# Patient Record
Sex: Male | Born: 1940 | Race: White | Hispanic: No | Marital: Married | Smoking: Never smoker
Health system: Southern US, Community
[De-identification: ages and names within clinical notes are randomized; demographics above are authoritative.]

## PROBLEM LIST (undated history)

## (undated) DIAGNOSIS — G20 Parkinson's disease: Secondary | ICD-10-CM

## (undated) DIAGNOSIS — K219 Gastro-esophageal reflux disease without esophagitis: Secondary | ICD-10-CM

## (undated) DIAGNOSIS — D869 Sarcoidosis, unspecified: Secondary | ICD-10-CM

## (undated) DIAGNOSIS — G20A1 Parkinson's disease without dyskinesia, without mention of fluctuations: Secondary | ICD-10-CM

## (undated) HISTORY — DX: Gastro-esophageal reflux disease without esophagitis: K21.9

## (undated) HISTORY — DX: Parkinson's disease without dyskinesia, without mention of fluctuations: G20.A1

## (undated) HISTORY — DX: Sarcoidosis, unspecified: D86.9

## (undated) HISTORY — DX: Parkinson's disease: G20

---

## 2012-08-07 ENCOUNTER — Ambulatory Visit: Payer: Self-pay | Admitting: Internal Medicine

## 2012-09-10 ENCOUNTER — Ambulatory Visit: Payer: Self-pay | Admitting: Gastroenterology

## 2014-08-31 DIAGNOSIS — R0609 Other forms of dyspnea: Secondary | ICD-10-CM | POA: Diagnosis not present

## 2014-08-31 DIAGNOSIS — Z79899 Other long term (current) drug therapy: Secondary | ICD-10-CM | POA: Diagnosis not present

## 2014-08-31 DIAGNOSIS — N400 Enlarged prostate without lower urinary tract symptoms: Secondary | ICD-10-CM | POA: Diagnosis not present

## 2014-08-31 DIAGNOSIS — E780 Pure hypercholesterolemia: Secondary | ICD-10-CM | POA: Diagnosis not present

## 2014-09-07 DIAGNOSIS — R0609 Other forms of dyspnea: Secondary | ICD-10-CM | POA: Diagnosis not present

## 2014-09-14 DIAGNOSIS — H35372 Puckering of macula, left eye: Secondary | ICD-10-CM | POA: Diagnosis not present

## 2015-03-01 DIAGNOSIS — R634 Abnormal weight loss: Secondary | ICD-10-CM | POA: Diagnosis not present

## 2015-03-01 DIAGNOSIS — E559 Vitamin D deficiency, unspecified: Secondary | ICD-10-CM | POA: Diagnosis not present

## 2015-03-01 DIAGNOSIS — Z125 Encounter for screening for malignant neoplasm of prostate: Secondary | ICD-10-CM | POA: Diagnosis not present

## 2015-03-01 DIAGNOSIS — Z0000 Encounter for general adult medical examination without abnormal findings: Secondary | ICD-10-CM | POA: Diagnosis not present

## 2015-03-01 DIAGNOSIS — N400 Enlarged prostate without lower urinary tract symptoms: Secondary | ICD-10-CM | POA: Diagnosis not present

## 2015-03-01 DIAGNOSIS — E780 Pure hypercholesterolemia: Secondary | ICD-10-CM | POA: Diagnosis not present

## 2015-03-01 DIAGNOSIS — Z79899 Other long term (current) drug therapy: Secondary | ICD-10-CM | POA: Diagnosis not present

## 2015-03-09 DIAGNOSIS — Z1211 Encounter for screening for malignant neoplasm of colon: Secondary | ICD-10-CM | POA: Diagnosis not present

## 2015-03-18 DIAGNOSIS — H35373 Puckering of macula, bilateral: Secondary | ICD-10-CM | POA: Diagnosis not present

## 2015-04-13 DIAGNOSIS — D485 Neoplasm of uncertain behavior of skin: Secondary | ICD-10-CM | POA: Diagnosis not present

## 2015-04-13 DIAGNOSIS — L821 Other seborrheic keratosis: Secondary | ICD-10-CM | POA: Diagnosis not present

## 2015-04-13 DIAGNOSIS — C44329 Squamous cell carcinoma of skin of other parts of face: Secondary | ICD-10-CM | POA: Diagnosis not present

## 2015-04-22 DIAGNOSIS — C44329 Squamous cell carcinoma of skin of other parts of face: Secondary | ICD-10-CM | POA: Diagnosis not present

## 2015-08-24 DIAGNOSIS — Z872 Personal history of diseases of the skin and subcutaneous tissue: Secondary | ICD-10-CM | POA: Diagnosis not present

## 2015-08-24 DIAGNOSIS — L821 Other seborrheic keratosis: Secondary | ICD-10-CM | POA: Diagnosis not present

## 2015-08-24 DIAGNOSIS — L570 Actinic keratosis: Secondary | ICD-10-CM | POA: Diagnosis not present

## 2015-08-24 DIAGNOSIS — Z85828 Personal history of other malignant neoplasm of skin: Secondary | ICD-10-CM | POA: Diagnosis not present

## 2015-08-24 DIAGNOSIS — X32XXXA Exposure to sunlight, initial encounter: Secondary | ICD-10-CM | POA: Diagnosis not present

## 2015-08-24 DIAGNOSIS — Z08 Encounter for follow-up examination after completed treatment for malignant neoplasm: Secondary | ICD-10-CM | POA: Diagnosis not present

## 2015-08-30 DIAGNOSIS — N400 Enlarged prostate without lower urinary tract symptoms: Secondary | ICD-10-CM | POA: Diagnosis not present

## 2015-08-30 DIAGNOSIS — E7800 Pure hypercholesterolemia, unspecified: Secondary | ICD-10-CM | POA: Diagnosis not present

## 2015-08-30 DIAGNOSIS — E559 Vitamin D deficiency, unspecified: Secondary | ICD-10-CM | POA: Diagnosis not present

## 2015-08-30 DIAGNOSIS — J069 Acute upper respiratory infection, unspecified: Secondary | ICD-10-CM | POA: Diagnosis not present

## 2015-08-30 DIAGNOSIS — Z79899 Other long term (current) drug therapy: Secondary | ICD-10-CM | POA: Diagnosis not present

## 2015-09-07 DIAGNOSIS — R4189 Other symptoms and signs involving cognitive functions and awareness: Secondary | ICD-10-CM | POA: Diagnosis not present

## 2015-09-07 DIAGNOSIS — R490 Dysphonia: Secondary | ICD-10-CM | POA: Diagnosis not present

## 2015-09-07 DIAGNOSIS — E538 Deficiency of other specified B group vitamins: Secondary | ICD-10-CM | POA: Diagnosis not present

## 2015-09-07 DIAGNOSIS — G20 Parkinson's disease: Secondary | ICD-10-CM | POA: Diagnosis not present

## 2015-09-08 DIAGNOSIS — G20 Parkinson's disease: Secondary | ICD-10-CM | POA: Insufficient documentation

## 2015-09-08 DIAGNOSIS — G20A1 Parkinson's disease without dyskinesia, without mention of fluctuations: Secondary | ICD-10-CM | POA: Insufficient documentation

## 2015-09-10 DIAGNOSIS — H35373 Puckering of macula, bilateral: Secondary | ICD-10-CM | POA: Diagnosis not present

## 2015-09-22 ENCOUNTER — Other Ambulatory Visit: Payer: Self-pay | Admitting: Neurology

## 2015-09-22 DIAGNOSIS — R2689 Other abnormalities of gait and mobility: Secondary | ICD-10-CM

## 2015-10-11 ENCOUNTER — Ambulatory Visit
Admission: RE | Admit: 2015-10-11 | Discharge: 2015-10-11 | Disposition: A | Discharge status: Home / Self Care | Payer: Commercial Managed Care - HMO | Source: Ambulatory Visit | Attending: Neurology | Admitting: Neurology

## 2015-10-11 DIAGNOSIS — R4189 Other symptoms and signs involving cognitive functions and awareness: Secondary | ICD-10-CM | POA: Insufficient documentation

## 2015-10-11 DIAGNOSIS — G319 Degenerative disease of nervous system, unspecified: Secondary | ICD-10-CM | POA: Insufficient documentation

## 2015-10-11 DIAGNOSIS — I739 Peripheral vascular disease, unspecified: Secondary | ICD-10-CM | POA: Diagnosis not present

## 2015-10-11 DIAGNOSIS — G20 Parkinson's disease: Secondary | ICD-10-CM | POA: Insufficient documentation

## 2015-10-11 DIAGNOSIS — R2689 Other abnormalities of gait and mobility: Secondary | ICD-10-CM

## 2015-10-11 DIAGNOSIS — R930 Abnormal findings on diagnostic imaging of skull and head, not elsewhere classified: Secondary | ICD-10-CM | POA: Diagnosis not present

## 2015-10-27 ENCOUNTER — Ambulatory Visit: Payer: Commercial Managed Care - HMO | Admitting: Physical Therapy

## 2015-10-27 ENCOUNTER — Encounter: Payer: Self-pay | Admitting: Speech Pathology

## 2015-10-27 ENCOUNTER — Ambulatory Visit: Payer: Commercial Managed Care - HMO | Attending: Neurology | Admitting: Speech Pathology

## 2015-10-27 ENCOUNTER — Encounter: Payer: Self-pay | Admitting: Physical Therapy

## 2015-10-27 DIAGNOSIS — R490 Dysphonia: Secondary | ICD-10-CM | POA: Insufficient documentation

## 2015-10-27 DIAGNOSIS — R2681 Unsteadiness on feet: Secondary | ICD-10-CM | POA: Insufficient documentation

## 2015-10-27 DIAGNOSIS — R262 Difficulty in walking, not elsewhere classified: Secondary | ICD-10-CM | POA: Insufficient documentation

## 2015-10-27 NOTE — Therapy (Signed)
[place] MAIN [facility] SERVICES [address] [place], [place], [address] Phone: [phone]   Fax:  [phone]  Speech Language Pathology Evaluation  Patient Details  Name: Barry Joyce MRN: [id number] Date of Birth: 08-25-1940 Referring Provider: Dr.Shah  Encounter Date: 10/27/2015      End of Session - 10/27/15 1021    Visit Number 1   Number of Visits 17   Date for SLP Re-Evaluation 12/01/15   SLP Start Time 1000   SLP Stop Time  [id number]   SLP Time Calculation (min) 51 min   Activity Tolerance Patient tolerated treatment well      Past Medical History  Diagnosis Date   GERD (gastroesophageal reflux disease)    Parkinson disease ([place])    Sarcoidosis ([place])     History reviewed. No pertinent past surgical history.  There were no vitals filed for this visit.      Subjective Assessment - 10/27/15 1020    Subjective The patient reports that speech/voice changes due to Parkinson's; changes include hypophonia, hoarse vocal quality, monotone speech.   Currently in Pain? No/denies            SLP Evaluation [facility] - 10/27/15 0001    SLP Visit Information   SLP Received On 10/27/15   Referring Provider Dr.Shah   Onset Date 09/07/2015   Medical Diagnosis Parkinson's disease   Subjective   Patient/Family Stated Goal Maximize communication   Prior Functional Status   Cognitive/Linguistic Baseline Within functional limits   Oral Motor/Sensory Function   Overall Oral Motor/Sensory Function Appears within functional limits for tasks assessed   Motor Speech   Overall Motor Speech Impaired   Respiration Impaired   Level of Impairment Conversation   Phonation Low vocal intensity   Resonance Within functional limits   Articulation Within functional limitis   Intelligibility Intelligible   Phonation Impaired   Vocal Abuses Habitual Hyperphonia  Hoarse   Standardized Assessments   Standardized Assessments  Other Assessment   LSVT Perceptual Voice Evaluation        LSVT-LOUD Voice Evaluation  Voice history: The patient reports that speech/voice changes due to Parkinsons; changes include hypophonia, hoarse vocal quality, monotone speech.  Maximum phonation time for sustained ah: 25 seconds  Mean intensity during sustained ah: 68 dB   Mean intensity sustained during conversational speech: 63 dB  Average fundamental frequency during sustained ah: 179 Hz (1.5 STD above mean for age and gender)  Visi-Pitch: Museum/gallery exhibitions officer (MDVP)  MDVP extracts objective quantitative values (Relative Average Perturbation, Shimmer, Voice Turbulence Index, and Noise to Harmonic Ratio) on sustained phonation, which are displayed graphically and numerically in comparison to a built-in normative database.  The patient exhibited values outside the norm for Relative Average Perturbation, Shimmer, Voice Turbulence Index, and Noise to Harmonic Ratio.  Average fundamental frequency was 3 STD below the average for age and gender. The patient improved all parameters when cued to alter voicing (loud like me).   Simulatability: Improved vocal quality with loud voice.         SLP Education - 10/27/15 1020    Education provided Yes   Education Details LSVT-LOUD protocol   Person(s) Educated Patient   Methods Explanation   Comprehension Verbalized understanding            SLP Long Term Goals - 10/27/15 1022    SLP LONG TERM GOAL #1   Title The patient will complete Daily Tasks (Maximum duration "ah", High/Lows, and Functional  Phrases) at average loudness of 80 dB and with loud, good quality voice.    Time 4   Period Weeks   Status New   SLP LONG TERM GOAL #2   Title The patient will complete Hierarchal Speech Loudness reading drills (words/phrases, sentences, and paragraph) at average 75 dB and with loud, good quality voice.     Time 4   Period Weeks   Status New   SLP LONG TERM GOAL #3   Title The  patient will complete homework daily.   Time 4   Period Weeks   Status New   SLP LONG TERM GOAL #4   Title The patient will participate in conversation, maintaining average loudness of 75 dB and loud, good quality voice.   Time 4   Period Weeks   Status New          Plan - 10/27/15 1025    Clinical Impression Statement This 75 year old man with diagnosed Parkinson's disease is presenting with moderate voice disorder characterized by hoarse vocal quality, hypophonia, and monotone voice.  Based on stimulability testing, the patient is judged to be a good candidate for the LSVT LOUD program.  It is recommended that the patient receive the LSVT LOUD program which is comprised of 16 intensive sessions (4 times per week for 4 weeks, one hour sessions).  Prognosis for improvement is good based on motivation, stimulability, and strong family support.  LSVT LOUD has been documented in the literature as efficacious for individuals with Parkinson's disease.     Speech Therapy Frequency 4x / week   Duration 4 weeks   Treatment/Interventions Other (comment)  LSVT-LOUD   Potential to Achieve Goals Good   Potential Considerations Ability to learn/carryover information;Co-morbidities;Cooperation/participation level;Medical prognosis;Previous level of function;Severity of impairments;Family/community support   SLP Home Exercise Plan LSVT-LOUD daily homework   Consulted and Agree with Plan of Care Patient      Patient will benefit from skilled therapeutic intervention in order to improve the following deficits and impairments:   Dysphonia - Plan: SLP plan of care cert/re-cert      G-Codes - [id number]    Functional Assessment Tool Used LSVT-LOUD evaluation protocol, clinical jegment   Functional Limitations Voice   Voice Current Status (G9171) At least 40 percent but less than 60 percent impaired, limited or restricted   Voice Goal Status (G9172) At least 1 percent but less than 20 percent  impaired, limited or restricted      Problem List There are no active problems to display for this patient.  Leroy Sea, MS/CCC- SLP  Lou Miner 10/27/2015, 11:11 AM  [place] MAIN [facility] SERVICES [address] [place], [place], [address] Phone: [phone]   Fax:  [phone]  Name: Barry Joyce MRN: [id number] Date of Birth: 20-Mar-1941

## 2015-10-27 NOTE — Therapy (Addendum)
[place] MAIN [facility] SERVICES [address] [place], [place], [address] Phone: [phone]   Fax:  [phone]  Physical Therapy Evaluation  Patient Details  Name: Barry Joyce MRN: [id number] Date of Birth: [birth year] Referring Provider: Manuella Ghazi  Encounter Date: 10/27/2015      PT End of Session - 10/27/15 1111    Visit Number 1   Number of Visits 17   Date for PT Re-Evaluation 11/26/15   PT Start Time 1100   PT Stop Time 1200   PT Time Calculation (min) 60 min      Past Medical History  Diagnosis Date  . GERD (gastroesophageal reflux disease)   . Parkinson disease ([place])   . Sarcoidosis ([place])     History reviewed. No pertinent past surgical history.  There were no vitals filed for this visit.       Subjective Assessment - 10/27/15 1100    Subjective Patient has some minor changes in his stepping during walking.    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 No/denies            [facility] PT Assessment - 10/27/15 0001    Assessment   Medical Diagnosis Parkinsons disease   Referring Provider Manuella Ghazi   Onset Date/Surgical Date 09/07/15   Hand Dominance Right   Prior Therapy none   Precautions   Precautions Fall   Restrictions   Weight Bearing Restrictions No   Balance Screen   Has the patient fallen in the past 6 months Yes   How many times? 1   Has the patient had a decrease in activity level because of a fear of falling?  Yes   Is the patient reluctant to leave their home because of a fear of falling?  No   Home Ecologist residence   Living Arrangements Spouse/significant other   Available Help at Discharge Family   Type of [place] to enter   Entrance Stairs-Number of Steps 3   Entrance Stairs-Rails None   Home Layout Two level   Alternate Level Stairs-Number of Steps 12   Alternate Level  Stairs-Rails Right   Home Equipment None   Prior Function   Level of Independence Independent   Vocation Retired   Leisure grandchildren, Insurance risk surveyor   Cognition   Overall Cognitive Status Within Functional Limits for tasks assessed      PAIN:  POSTURE: WFL  PROM/AROM: WFL  STRENGTH:  Graded on a 0-5 scale Muscle Group Left Right  Shoulder flex    Shoulder Abd    Shoulder Ext    Shoulder IR/ER    Elbow    Wrist/hand    Hip Flex 5 5  Hip Abd 5 5  Hip Add 5 5  Hip Ext 5 5  Hip IR/ER 5 5  Knee Flex 5 5  Knee Ext 5 5  Ankle DF 5 5  Ankle PF 5 5   SENSATION: WFL    FUNCTIONAL MOBILITY: WFL   BALANCE: RLE single leg is 4 seconds   GAIT: Decreased arm swing on left side, no AD, needs railing for steps OUTCOME MEASURES: TEST Outcome Interpretation  5 times sit<>stand 9.54sec >60 yo, >15 sec indicates increased risk for falls  10 meter walk test     1. 96            m/s <1.0 m/s indicates increased  risk for falls; limited community ambulator  Timed up and Go   4.18             sec <14 sec indicates increased risk for falls  6 minute walk test   1420             Feet 1000 feet is community Water quality scientist 56/56 <36/56 (100% risk for falls), 37-45 (80% risk for falls); 46-51 (>50% risk for falls); 52-55 (lower risk <25% of falls)  9 Hole Peg Test L:  26.67              R: 25.75         Plan - 11/01/15 1250    Clinical Impression Statement Patient has Dx of parkinsons and has unsteady gait and uncoordinated motor control with stepping patterns. He has decreased dynamic standing balance and decreasd fine motor coordination.    Rehab Potential Good   PT Frequency 4x / week   PT Duration 4 weeks   PT Treatment/Interventions Balance training;Therapeutic exercise;Therapeutic activities   PT Next Visit Plan LSVT BIG   Consulted and Agree with Plan of Care Patient                          PT Education - 05-Nov-2015 1111    Education  provided Yes   Education Details LSVT BIG   Person(s) Educated Patient   Methods Explanation   Comprehension Verbalized understanding             PT Long Term Goals - 2015/11/05 1152    PT LONG TERM GOAL #2   Title Patient will tolerate 5 seconds of single leg stance without loss of balance to improve ability to get in and out of shower safely   Time 4   Period Weeks   Status New   PT LONG TERM GOAL #3   Title Patient will ascend/descend 4 stairs without rail assist independently without loss of balance to improve ability to get in/out of home   Time 4   Period Weeks   Status New             Patient will benefit from skilled therapeutic intervention in order to improve the following deficits and impairments:     Visit Diagnosis: Difficulty in walking, not elsewhere classified  Unsteadiness on feet      G-Codes - 05-Nov-2015 1125    Functional Assessment Tool Used 6 mw, berg balance   Functional Limitation Mobility: Walking and moving around   Mobility: Walking and Moving Around Current Status [id number]) At least 1 percent but less than 20 percent impaired, limited or restricted   Mobility: Walking and Moving Around Goal Status [id number]) 0 percent impaired, limited or restricted       Problem List There are no active problems to display for this patient.  Alanson Puls, PT, DPT [place], Minette Headland S 11/05/15, 11:55 AM  [place] MAIN [facility] SERVICES [address] [place], [place], [address] Phone: [phone]   Fax:  [phone]  Name: Barry Joyce MRN: [id number] Date of Birth: 1940-12-21

## 2015-11-01 ENCOUNTER — Ambulatory Visit: Payer: Commercial Managed Care - HMO | Admitting: Physical Therapy

## 2015-11-01 ENCOUNTER — Ambulatory Visit: Payer: Commercial Managed Care - HMO | Attending: Neurology | Admitting: Speech Pathology

## 2015-11-01 ENCOUNTER — Encounter: Payer: Self-pay | Admitting: Speech Pathology

## 2015-11-01 ENCOUNTER — Encounter: Payer: Self-pay | Admitting: Physical Therapy

## 2015-11-01 DIAGNOSIS — R262 Difficulty in walking, not elsewhere classified: Secondary | ICD-10-CM | POA: Insufficient documentation

## 2015-11-01 DIAGNOSIS — R490 Dysphonia: Secondary | ICD-10-CM | POA: Insufficient documentation

## 2015-11-01 DIAGNOSIS — R2681 Unsteadiness on feet: Secondary | ICD-10-CM

## 2015-11-01 NOTE — Therapy (Signed)
[place] MAIN [facility] SERVICES [address] [place], [place], [address] Phone: [phone]   Fax:  [phone]  Speech Language Pathology Treatment  Patient Details  Name: Barry Joyce MRN: [id number] Date of Birth: 1941/02/24 Referring Provider: Dr.Shah  Encounter Date: 11/01/2015      End of Session - 11/01/15 1521    Visit Number 2   Number of Visits 17   Date for SLP Re-Evaluation 12/01/15   SLP Start Time 1000   SLP Stop Time  1100   SLP Time Calculation (min) 60 min   Activity Tolerance Patient tolerated treatment well      Past Medical History  Diagnosis Date  . GERD (gastroesophageal reflux disease)   . Parkinson disease ([place])   . Sarcoidosis ([place])     History reviewed. No pertinent past surgical history.  There were no vitals filed for this visit.      Subjective Assessment - 11/01/15 1520    Subjective The patient reports that speech/voice changes due to Parkinson's; changes include hypophonia, hoarse vocal quality, monotone speech.   Currently in Pain? No/denies               ADULT SLP TREATMENT - 11/01/15 0001    General Information   Behavior/Cognition Alert;Cooperative;Pleasant mood   Treatment Provided   Treatment provided Cognitive-Linquistic   Pain Assessment   Pain Assessment No/denies pain   Cognitive-Linquistic Treatment   Treatment focused on Voice   Skilled Treatment Daily Task #1 (Maximum sustained "ah"): Average 12 seconds, 80 dB. Daily Task 2 (Maximum fundamental frequency range): Highs: 15 high pitched "ah" given max cues. Lows: 15 low pitched "ah" given max cues. Daily task #3 (Maximum speech loudness drill of functional phrases): Average 70 dB.  Hierarchal speech loudness drill: Read phrases/sentences, 72 dB. Generate short responses, 71 dB.  Homework: assignments given.  Off the cuff remarks: average 69 dB.      Assessment / Recommendations / Plan   Plan Continue with current plan of  care   Progression Toward Goals   Progression toward goals Progressing toward goals          SLP Education - 11/01/15 1520    Education provided Yes   Education Details LSVT-LOUD   Person(s) Educated Patient   Methods Explanation;Demonstration;Verbal cues;Handout   Comprehension Verbalized understanding;Returned demonstration;Verbal cues required;Need further instruction            SLP Long Term Goals - 10/27/15 1022    SLP LONG TERM GOAL #1   Title The patient will complete Daily Tasks (Maximum duration "ah", High/Lows, and Functional Phrases) at average loudness of 80 dB and with loud, good quality voice.    Time 4   Period Weeks   Status New   SLP LONG TERM GOAL #2   Title The patient will complete Hierarchal Speech Loudness reading drills (words/phrases, sentences, and paragraph) at average 75 dB and with loud, good quality voice.     Time 4   Period Weeks   Status New   SLP LONG TERM GOAL #3   Title The patient will complete homework daily.   Time 4   Period Weeks   Status New   SLP LONG TERM GOAL #4   Title The patient will participate in conversation, maintaining average loudness of 75 dB and loud, good quality voice.   Time 4   Period Weeks   Status New          Plan - 11/01/15  [place] The patient is completing daily tasks and hierarchal speech drill tasks with loud, good quality voice given mod-max SLP cues for vocal quality and loudness.     Speech Therapy Frequency 4x / week   Duration 4 weeks   Treatment/Interventions --  LSVT-LOUD   Potential to Achieve Goals Good   Potential Considerations Ability to learn/carryover information;Co-morbidities;Cooperation/participation level;Medical prognosis;Previous level of function;Severity of impairments;Family/community support   SLP Home Exercise Plan LSVT-LOUD daily homework   Consulted and Agree with Plan of Care Patient      Patient will benefit from skilled therapeutic  intervention in order to improve the following deficits and impairments:   Dysphonia    Problem List There are no active problems to display for this patient.  Barry Joyce, [place], [name] 11/01/2015, 3:22 PM  [place] MAIN [facility] SERVICES [address] [place], [place], [address] Phone: [phone]   Fax:  [phone]   Name: Barry Joyce MRN: [id number] Date of Birth: [birth year]

## 2015-11-01 NOTE — Addendum Note (Signed)
Addended by: Alanson Puls on: 11/01/2015 01:03 PM   Modules accepted: Orders

## 2015-11-01 NOTE — Therapy (Signed)
[place] MAIN [facility] SERVICES [address] [place], [place], [address] Phone: [phone]   Fax:  [phone]  Physical Therapy Treatment  Patient Details  Name: Barry Joyce MRN: [id number] Date of Birth: Feb 07, 1941 Referring Provider: Manuella Ghazi  Encounter Date: 11/01/2015      PT End of Session - 11/01/15 1240    Visit Number 2   Number of Visits 17   Date for PT Re-Evaluation 11/26/15   PT Start Time 1100   PT Stop Time 1200   PT Time Calculation (min) 60 min      Past Medical History  Diagnosis Date  . GERD (gastroesophageal reflux disease)   . Parkinson disease ([place])   . Sarcoidosis ([place])     History reviewed. No pertinent past surgical history.  There were no vitals filed for this visit.      Subjective Assessment - 11/01/15 1239    Subjective Patient is ready to begin LSVT BIG.   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 No/denies        Floor to ceiling x 10 reps, side to side 10 reps, step and reach forward x 10 reps, step and reach backwards x 10, step and reach sideways x 10 Rock and reach forward/backward x 10 , Rock and reach sideways x 10, functional tasks; 1 sit to stand X 10. 2. High level balance activities with foam tapping, foam stepping, 1/2 foam roller with cone stacking low and fwd. Gait training with big swing reciprocally 1000 feet x 5. Patient has unsteady stepping with exercises and his motor control is slow. Patient needs occasional verbal cueing to improve posture and cueing to correctly perform exercises slowly, holding at end of range to increase motor firing of desired muscle to encourage fatigue                           PT Education - 11/01/15 1239    Education provided Yes   Education Details LSVT BIG   Person(s) Educated Patient   Methods Explanation   Comprehension Verbalized understanding              PT Long Term Goals - 11/01/15 1247    PT LONG TERM GOAL #2   Title Patient will tolerate 5 seconds of single leg stance without loss of balance to improve ability to get in and out of shower safely   Time 4   Period Weeks   Status New   PT LONG TERM GOAL #3   Title Patient will ascend/descend 4 stairs without rail assist independently without loss of balance to improve ability to get in/out of home   Time 4   Period Weeks   Status New               Plan - 11/01/15 1319    Clinical Impression Statement Patient has unsteady gait and uncoordinated motor control with stepping patterns.    Rehab Potential Good   PT Frequency 4x / week   PT Duration 4 weeks   PT Treatment/Interventions Balance training;Therapeutic exercise;Therapeutic activities   PT Next Visit Plan LSVT BIG   Consulted and Agree with Plan of Care Patient      Patient will benefit from skilled therapeutic intervention in order to improve the following deficits and impairments:  Decreased balance, Difficulty walking, Decreased coordination  Visit Diagnosis: Difficulty in walking, not elsewhere  classified  Unsteadiness on feet     Problem List There are no active problems to display for this patient. Alanson Puls, PT, DPT  [place], [place] S 11/01/2015, 1:21 PM  [place] MAIN [facility] SERVICES [address] [place], [place], [address] Phone: [phone]   Fax:  [phone]  Name: Barry Joyce MRN: [id number] Date of Birth: [birth year]

## 2015-11-02 ENCOUNTER — Encounter: Payer: Self-pay | Admitting: Physical Therapy

## 2015-11-02 ENCOUNTER — Ambulatory Visit: Payer: Commercial Managed Care - HMO | Admitting: Speech Pathology

## 2015-11-02 ENCOUNTER — Ambulatory Visit: Payer: Commercial Managed Care - HMO | Admitting: Physical Therapy

## 2015-11-02 ENCOUNTER — Encounter: Payer: Self-pay | Admitting: Speech Pathology

## 2015-11-02 DIAGNOSIS — R262 Difficulty in walking, not elsewhere classified: Secondary | ICD-10-CM

## 2015-11-02 DIAGNOSIS — R490 Dysphonia: Secondary | ICD-10-CM | POA: Diagnosis not present

## 2015-11-02 DIAGNOSIS — R2681 Unsteadiness on feet: Secondary | ICD-10-CM

## 2015-11-02 NOTE — Therapy (Signed)
[place] MAIN [facility] SERVICES [address] [place], [place], [address] Phone: [phone]   Fax:  [phone]  Physical Therapy Treatment  Patient Details  Name: Barry Joyce MRN: [id number] Date of Birth: 1941/02/26 Referring Provider: Manuella Ghazi  Encounter Date: 11/02/2015      PT End of Session - 11/02/15 1103    Visit Number 3   Number of Visits 17   Date for PT Re-Evaluation 11/26/15   PT Start Time 1100   PT Stop Time 1200   PT Time Calculation (min) 60 min      Past Medical History  Diagnosis Date  . GERD (gastroesophageal reflux disease)   . Parkinson disease ([employer])   . Sarcoidosis ([place])     History reviewed. No pertinent past surgical history.  There were no vitals filed for this visit.      Subjective Assessment - 11/02/15 1057    Subjective Patient is ready to begin LSVT BIG.   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 No/denies      Patient seen for LSVT Daily Session Maximal Daily Exercises for facilitation/coordination of movement Sustained movements are designed to rescale the amplitude of movement output for generalization to daily functional activities .Performed as follows for 1 set of 10 repetitions each multidirectional sustained movements 1) Floor to ceiling 2) Side to side multidirectional Repetitive movements performed in standing and are designed to provide retraining effort needed for sustained muscle activation in tasks   3) Step and reach forward 4) Step and reach backwards 5) Step and reach sideways 6) Rock and reach forward/backward 7) Rock and reach sideways Sit to stand functional component task with supervision 5 reps and  High level balance activities including stepping , tapping, standing on foam and stacking cones.                             PT Education - 11/02/15 1057    Education provided Yes   Education  Details LSVT BIG   Person(s) Educated Patient   Methods Explanation   Comprehension Verbalized understanding             PT Long Term Goals - 11/01/15 1247    PT LONG TERM GOAL #2   Title Patient will tolerate 5 seconds of single leg stance without loss of balance to improve ability to get in and out of shower safely   Time 4   Period Weeks   Status New   PT LONG TERM GOAL #3   Title Patient will ascend/descend 4 stairs without rail assist independently without loss of balance to improve ability to get in/out of home   Time 4   Period Weeks   Status New               Plan - 11/02/15 1104    Clinical Impression Statement Patient has unsteady gait and uncoordinated motor control with activities.    Rehab Potential Good   PT Frequency 4x / week   PT Duration 4 weeks   PT Treatment/Interventions Balance training;Therapeutic exercise;Therapeutic activities   PT Next Visit Plan LSVT BIG   Consulted and Agree with Plan of Care Patient      Patient will benefit from skilled therapeutic intervention in order to improve the following deficits and impairments:  Decreased balance, Difficulty walking, Decreased coordination  Visit Diagnosis: Difficulty in walking, not  elsewhere classified  Unsteadiness on feet     Problem List There are no active problems to display for this patient.  Alanson Puls, PT, DPT [place], Minette Headland S 11/02/2015, 11:07 AM  [place] MAIN [facility] SERVICES [address] [place], [place], [address] Phone: [phone]   Fax:  [phone]  Name: Barry Joyce MRN: [id number] Date of Birth: 1941/01/12

## 2015-11-02 NOTE — Therapy (Signed)
[place] MAIN [facility] SERVICES [address] [place], [place], [address] Phone: [phone]   Fax:  [phone]  Speech Language Pathology Treatment  Patient Details  Name: LINDLE MARMOLEJOS MRN: [id number] Date of Birth: [birth year] Referring Provider: Dr.Shah  Encounter Date: 11/02/2015      End of Session - 11/02/15 1251    Visit Number 3   Number of Visits 17   Date for SLP Re-Evaluation 12/01/15   SLP Start Time 1000   SLP Stop Time  1100   SLP Time Calculation (min) 60 min   Activity Tolerance Patient tolerated treatment well      Past Medical History  Diagnosis Date  . GERD (gastroesophageal reflux disease)   . Parkinson disease ([place])   . Sarcoidosis ([place])     History reviewed. No pertinent past surgical history.  There were no vitals filed for this visit.      Subjective Assessment - 11/02/15 1251    Subjective The patient reports that speech/voice changes due to Parkinson's; changes include hypophonia, hoarse vocal quality, monotone speech.   Currently in Pain? No/denies               ADULT SLP TREATMENT - 11/02/15 0001    General Information   Behavior/Cognition Alert;Cooperative;Pleasant mood   Treatment Provided   Treatment provided Cognitive-Linquistic   Pain Assessment   Pain Assessment No/denies pain   Cognitive-Linquistic Treatment   Treatment focused on Voice   Skilled Treatment Daily Task #1 (Maximum sustained "ah"): Average 15 seconds, 83 dB. Daily Task 2 (Maximum fundamental frequency range): Highs: 15 high pitched "ah" given mod-max cues. Lows: 15 low pitched "ah" given mod-max cues. Daily task #3 (Maximum speech loudness drill of functional phrases): Average 73 dB.  Hierarchal speech loudness drill: Read complex sentences, 72 dB. Homework: assignments completed.  Off the cuff remarks: average 60 dB.      Assessment / Recommendations / Plan   Plan Continue with current plan of care   Progression  Toward Goals   Progression toward goals Progressing toward goals          SLP Education - 11/02/15 1251    Education provided Yes   Education Details LSVT-LOUD   Person(s) Educated Patient   Methods Explanation   Comprehension Verbalized understanding            SLP Long Term Goals - 10/27/15 1022    SLP LONG TERM GOAL #1   Title The patient will complete Daily Tasks (Maximum duration "ah", High/Lows, and Functional Phrases) at average loudness of 80 dB and with loud, good quality voice.    Time 4   Period Weeks   Status New   SLP LONG TERM GOAL #2   Title The patient will complete Hierarchal Speech Loudness reading drills (words/phrases, sentences, and paragraph) at average 75 dB and with loud, good quality voice.     Time 4   Period Weeks   Status New   SLP LONG TERM GOAL #3   Title The patient will complete homework daily.   Time 4   Period Weeks   Status New   SLP LONG TERM GOAL #4   Title The patient will participate in conversation, maintaining average loudness of 75 dB and loud, good quality voice.   Time 4   Period Weeks   Status New          Plan - 11/02/15 1251    Clinical Impression Statement The patient is completing  daily tasks and hierarchal speech drill tasks with loud, good quality voice given mod-max SLP cues for vocal quality and loudness.     Speech Therapy Frequency 4x / week   Duration 4 weeks   Treatment/Interventions Other (comment)  LSVT-LOUD   Potential to Achieve Goals Good   Potential Considerations Ability to learn/carryover information;Co-morbidities;Cooperation/participation level;Medical prognosis;Previous level of function;Severity of impairments;Family/community support   SLP Home Exercise Plan LSVT-LOUD daily homework   Consulted and Agree with Plan of Care Patient      Patient will benefit from skilled therapeutic intervention in order to improve the following deficits and impairments:   Dysphonia    Problem  List There are no active problems to display for this patient.  Leroy Sea, [place], [name] 11/02/2015, 12:52 PM  [place] MAIN [facility] SERVICES [address] [place], [place], [address] Phone: [phone]   Fax:  [phone]   Name: CAYSON WEISENBERGER MRN: [id number] Date of Birth: 22-Jun-1941

## 2015-11-03 ENCOUNTER — Ambulatory Visit: Payer: Commercial Managed Care - HMO | Admitting: Speech Pathology

## 2015-11-03 ENCOUNTER — Ambulatory Visit: Payer: Commercial Managed Care - HMO | Admitting: Physical Therapy

## 2015-11-03 ENCOUNTER — Encounter: Payer: Self-pay | Admitting: Physical Therapy

## 2015-11-03 ENCOUNTER — Encounter: Payer: Self-pay | Admitting: Speech Pathology

## 2015-11-03 DIAGNOSIS — R490 Dysphonia: Secondary | ICD-10-CM | POA: Diagnosis not present

## 2015-11-03 DIAGNOSIS — R262 Difficulty in walking, not elsewhere classified: Secondary | ICD-10-CM | POA: Diagnosis not present

## 2015-11-03 DIAGNOSIS — R2681 Unsteadiness on feet: Secondary | ICD-10-CM | POA: Diagnosis not present

## 2015-11-03 NOTE — Therapy (Signed)
[place] MAIN [facility] SERVICES [address] [place], [place], [address] Phone: [phone]   Fax:  [phone]  Speech Language Pathology Treatment  Patient Details  Name: Barry Joyce MRN: [id number] Date of Birth: 12-29-1940 Referring Provider: Dr.Shah  Encounter Date: 11/03/2015      End of Session - 11/03/15 1215    Visit Number 4   Number of Visits 17   Date for SLP Re-Evaluation 12/01/15   SLP Start Time 1000   SLP Stop Time  1100   SLP Time Calculation (min) 60 min   Activity Tolerance Patient tolerated treatment well      Past Medical History  Diagnosis Date  . GERD (gastroesophageal reflux disease)   . Parkinson disease ([place])   . Sarcoidosis ([place])     History reviewed. No pertinent past surgical history.  There were no vitals filed for this visit.      Subjective Assessment - 11/03/15 1215    Subjective The patient reports that speech/voice changes due to Parkinson's; changes include hypophonia, hoarse vocal quality, monotone speech.   Currently in Pain? No/denies               ADULT SLP TREATMENT - 11/03/15 0001    General Information   Behavior/Cognition Alert;Cooperative;Pleasant mood   Treatment Provided   Treatment provided Cognitive-Linquistic   Pain Assessment   Pain Assessment No/denies pain   Cognitive-Linquistic Treatment   Treatment focused on Voice   Skilled Treatment Daily Task #1 (Maximum sustained "ah"): Average 15 seconds, 83 dB. Daily Task 2 (Maximum fundamental frequency range): Highs: 15 high pitched "ah" given mod-max cues. Lows: 15 low pitched "ah" given mod-max cues. Daily task #3 (Maximum speech loudness drill of functional phrases): Average 73 dB.  Hierarchal speech loudness drill: Read complex sentences, 72 dB. Homework: assignments completed.  Off the cuff remarks: average 60 dB.      Assessment / Recommendations / Plan   Plan Continue with current plan of care   Progression  Toward Goals   Progression toward goals Progressing toward goals          SLP Education - 11/03/15 1215    Education provided Yes   Education Details LSVT-LOUD   Person(s) Educated Patient   Methods Explanation   Comprehension Verbalized understanding            SLP Long Term Goals - 10/27/15 1022    SLP LONG TERM GOAL #1   Title The patient will complete Daily Tasks (Maximum duration "ah", High/Lows, and Functional Phrases) at average loudness of 80 dB and with loud, good quality voice.    Time 4   Period Weeks   Status New   SLP LONG TERM GOAL #2   Title The patient will complete Hierarchal Speech Loudness reading drills (words/phrases, sentences, and paragraph) at average 75 dB and with loud, good quality voice.     Time 4   Period Weeks   Status New   SLP LONG TERM GOAL #3   Title The patient will complete homework daily.   Time 4   Period Weeks   Status New   SLP LONG TERM GOAL #4   Title The patient will participate in conversation, maintaining average loudness of 75 dB and loud, good quality voice.   Time 4   Period Weeks   Status New          Plan - 11/03/15 1216    Clinical Impression Statement The patient is completing  daily tasks and hierarchal speech drill tasks with loud, good quality voice given mod-max SLP cues for vocal quality and loudness.     Speech Therapy Frequency 4x / week   Duration 4 weeks   Treatment/Interventions Other (comment)  LSVT-LOUD   Potential to Achieve Goals Good   Potential Considerations Ability to learn/carryover information;Co-morbidities;Cooperation/participation level;Medical prognosis;Previous level of function;Severity of impairments;Family/community support   SLP Home Exercise Plan LSVT-LOUD daily homework   Consulted and Agree with Plan of Care Patient      Patient will benefit from skilled therapeutic intervention in order to improve the following deficits and impairments:   Dysphonia    Problem  List There are no active problems to display for this patient.  Barry Joyce, [place], Barry Joyce 11/03/2015, 12:17 PM  [place] MAIN [facility] SERVICES [address] [place], [place], [address] Phone: [phone]   Fax:  [phone]   Name: Barry Joyce MRN: [id number] Date of Birth: 01-23-1941

## 2015-11-03 NOTE — Therapy (Signed)
[place] MAIN [facility] SERVICES [address] [place], [place], [address] Phone: [phone]   Fax:  [phone]  Physical Therapy Treatment  Patient Details  Name: Barry Joyce MRN: [id number] Date of Birth: Aug 03, 1940 Referring Provider: Manuella Ghazi  Encounter Date: 11/03/2015      PT End of Session - 11/03/15 1104    Visit Number 4   Number of Visits 17   Date for PT Re-Evaluation 11/26/15   PT Start Time 1100   PT Stop Time 1200   PT Time Calculation (min) 60 min      Past Medical History  Diagnosis Date  . GERD (gastroesophageal reflux disease)   . Parkinson disease ([place])   . Sarcoidosis ([place])     History reviewed. No pertinent past surgical history.  There were no vitals filed for this visit.      Subjective Assessment - 11/03/15 1104    Subjective Patient is ready to begin LSVT BIG.   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 No/denies        Patient seen for LSVT Daily Session Maximal Daily Exercises for facilitation/coordination of movement Sustained movements are designed to rescale the amplitude of movement output for generalization to daily functional activities .Performed as follows for 1 set of 10 repetitions each multidirectional sustained movements 1) Floor to ceiling 2) Side to side multidirectional Repetitive movements performed in standing and are designed to provide retraining effort needed for sustained muscle activation in tasks   3) Step and reach forward 4) Step and reach backwards 5) Step and reach sideways 6) Rock and reach forward/backward 7) Rock and reach sideways Sit to stand functional component task with supervision 5 reps and  simulated .activities for: dynamic standing balance  Walking with big swing 1000 feet x 3. Min cueing needed to appropriately perform LSVT tasks with leg, hand, and head position. Decreased coordination demonstrated  requiring consistent verbal cueing to correct form. Cognitive understanding of task was delayed. Patient continues to demonstrate some in coordination of movement with select exercises such as rock and reach and stepping backwards. Patient responds well to verbal and tactile cues to correct form and technique.  CGA to SBA for safety with activities.  Uses to increase intensity and amplitude of movements throughout session                          PT Education - 11/03/15 1104    Education provided Yes   Education Details LSVT BIG   Person(s) Educated Patient   Methods Explanation   Comprehension Verbalized understanding             PT Long Term Goals - 11/01/15 1247    PT LONG TERM GOAL #2   Title Patient will tolerate 5 seconds of single leg stance without loss of balance to improve ability to get in and out of shower safely   Time 4   Period Weeks   Status New   PT LONG TERM GOAL #3   Title Patient will ascend/descend 4 stairs without rail assist independently without loss of balance to improve ability to get in/out of home   Time 4   Period Weeks   Status New               Plan - 11/03/15 1105    Clinical Impression Statement Continues to have balance deficits typical with diagnosis.  Patient performs intermediate level exercises without pain behaviors and needs verbal cuing for postural alignment and head positioning   Rehab Potential Good   PT Frequency 4x / week   PT Duration 4 weeks   PT Treatment/Interventions Balance training;Therapeutic exercise;Therapeutic activities   PT Next Visit Plan LSVT BIG   Consulted and Agree with Plan of Care Patient      Patient will benefit from skilled therapeutic intervention in order to improve the following deficits and impairments:  Decreased balance, Difficulty walking, Decreased coordination  Visit Diagnosis: Difficulty in walking, not elsewhere classified  Unsteadiness on feet     Problem  List There are no active problems to display for this patient.  Alanson Puls, PT, DPT [place], Minette Headland S 11/03/2015, 11:10 AM  [place] MAIN [facility] SERVICES [address] [place], [place], [address] Phone: [phone]   Fax:  [phone]  Name: Barry Joyce MRN: [id number] Date of Birth: [birth year]

## 2015-11-04 ENCOUNTER — Ambulatory Visit: Payer: Commercial Managed Care - HMO | Admitting: Physical Therapy

## 2015-11-04 ENCOUNTER — Ambulatory Visit: Payer: Commercial Managed Care - HMO | Admitting: Speech Pathology

## 2015-11-04 ENCOUNTER — Encounter: Payer: Self-pay | Admitting: Speech Pathology

## 2015-11-04 ENCOUNTER — Encounter: Payer: Self-pay | Admitting: Physical Therapy

## 2015-11-04 DIAGNOSIS — R2681 Unsteadiness on feet: Secondary | ICD-10-CM | POA: Diagnosis not present

## 2015-11-04 DIAGNOSIS — R262 Difficulty in walking, not elsewhere classified: Secondary | ICD-10-CM

## 2015-11-04 DIAGNOSIS — R490 Dysphonia: Secondary | ICD-10-CM | POA: Diagnosis not present

## 2015-11-04 NOTE — Therapy (Signed)
[place] MAIN [facility] SERVICES [address] [place], [place], [address] Phone: [phone]   Fax:  [phone]  Speech Language Pathology Treatment  Patient Details  Name: Barry Joyce MRN: [id number] Date of Birth: 11/03/1940 Referring Provider: Dr.Shah  Encounter Date: 11/04/2015      End of Session - 11/04/15 1212    Visit Number 5   Number of Visits 17   Date for SLP Re-Evaluation 12/01/15   SLP Start Time 1000   SLP Stop Time  1100   SLP Time Calculation (min) 60 min   Activity Tolerance Patient tolerated treatment well      Past Medical History  Diagnosis Date  . GERD (gastroesophageal reflux disease)   . Parkinson disease ([place])   . Sarcoidosis ([place])     History reviewed. No pertinent past surgical history.  There were no vitals filed for this visit.      Subjective Assessment - 11/04/15 1212    Subjective The patient reports that speech/voice changes due to Parkinson's; changes include hypophonia, hoarse vocal quality, monotone speech.   Currently in Pain? No/denies               ADULT SLP TREATMENT - 11/04/15 0001    General Information   Behavior/Cognition Alert;Cooperative;Pleasant mood   Treatment Provided   Treatment provided Cognitive-Linquistic   Pain Assessment   Pain Assessment No/denies pain   Cognitive-Linquistic Treatment   Treatment focused on Voice   Skilled Treatment Daily Task #1 (Maximum sustained "ah"): Average 15 seconds, 83 dB. Daily Task 2 (Maximum fundamental frequency range): Highs: 15 high pitched "ah" given mod-max cues. Lows: 15 low pitched "ah" given mod-max cues. Daily task #3 (Maximum speech loudness drill of functional phrases): Average 73 dB.  Hierarchal speech loudness drill: Read complex sentences, 72 dB. Homework: assignments completed.  Off the cuff remarks: average 60 dB.      Assessment / Recommendations / Plan   Plan Continue with current plan of care   Progression  Toward Goals   Progression toward goals Progressing toward goals          SLP Education - 11/04/15 1212    Education provided Yes   Education Details LSVT-LOUD   Person(s) Educated Patient   Methods Explanation   Comprehension Verbalized understanding            SLP Long Term Goals - 10/27/15 1022    SLP LONG TERM GOAL #1   Title The patient will complete Daily Tasks (Maximum duration "ah", High/Lows, and Functional Phrases) at average loudness of 80 dB and with loud, good quality voice.    Time 4   Period Weeks   Status New   SLP LONG TERM GOAL #2   Title The patient will complete Hierarchal Speech Loudness reading drills (words/phrases, sentences, and paragraph) at average 75 dB and with loud, good quality voice.     Time 4   Period Weeks   Status New   SLP LONG TERM GOAL #3   Title The patient will complete homework daily.   Time 4   Period Weeks   Status New   SLP LONG TERM GOAL #4   Title The patient will participate in conversation, maintaining average loudness of 75 dB and loud, good quality voice.   Time 4   Period Weeks   Status New          Plan - 11/04/15 1212    Clinical Impression Statement The patient is completing  daily tasks and hierarchal speech drill tasks with loud, good quality voice given mod-max SLP cues for vocal quality and loudness.     Speech Therapy Frequency 4x / week   Duration 4 weeks   Treatment/Interventions Other (comment)  LSVT-LOUD   Potential to Achieve Goals Good   Potential Considerations Ability to learn/carryover information;Co-morbidities;Cooperation/participation level;Medical prognosis;Previous level of function;Severity of impairments;Family/community support   SLP Home Exercise Plan LSVT-LOUD daily homework   Consulted and Agree with Plan of Care Patient      Patient will benefit from skilled therapeutic intervention in order to improve the following deficits and impairments:   Dysphonia    Problem  List There are no active problems to display for this patient.  Leroy Sea, [place], [name] 11/04/2015, 12:13 PM  [place] MAIN [facility] SERVICES [address] [place], [place], [address] Phone: [phone]   Fax:  [phone]   Name: YAMEEN BROBERG MRN: [id number] Date of Birth: [birth year]

## 2015-11-04 NOTE — Therapy (Signed)
[place] MAIN [facility] SERVICES [address] [place], [place], [address] Phone: [phone]   Fax:  [phone]  Physical Therapy Treatment  Patient Details  Name: Barry Joyce MRN: [id number] Date of Birth: 31-Aug-1940 Referring Provider: Manuella Ghazi  Encounter Date: 11/04/2015      PT End of Session - 11/04/15 1038    Visit Number 5   Number of Visits 17   Date for PT Re-Evaluation 11/26/15   PT Start Time 1100   PT Stop Time 1200   PT Time Calculation (min) 60 min   Equipment Utilized During Treatment Gait belt      Past Medical History  Diagnosis Date  . GERD (gastroesophageal reflux disease)   . Parkinson disease ([place])   . Sarcoidosis ([place])     History reviewed. No pertinent past surgical history.  There were no vitals filed for this visit.      Subjective Assessment - 11/03/15 1104    Subjective Patient is ready to begin LSVT BIG.   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 No/denies      Patient seen for LSVT Daily Session Maximal Daily Exercises for facilitation/coordination of movement Sustained movements are designed to rescale the amplitude of movement output for generalization to daily functional activities .Performed as follows for 1 set of 10 repetitions each multidirectional sustained movements 1) Floor to ceiling 2) Side to side multidirectional Repetitive movements performed in standing and are designed to provide retraining effort needed for sustained muscle activation in tasks   3) Step and reach forward 4) Step and reach backwards 5) Step and reach sideways 6) Rock and reach forward/backward 7) Rock and reach sideways Sit to stand functional component task with supervision 5 reps and simulated activities for dynamic standing balance including stepping up on to a step from a foam cushion, reaching with cones to step stool. Functional mobility: ambulated  >1000' with fair cadence and reciprocating arm swing holding 1 pound hand weights while doing dual naming tasks                            PT Education - 11/04/15 1038    Education provided Yes   Education Details LSVT BIG start and stop positions   Person(s) Educated Patient   Methods Explanation   Comprehension Verbalized understanding             PT Long Term Goals - 11/01/15 1247    PT LONG TERM GOAL #2   Title Patient will tolerate 5 seconds of single leg stance without loss of balance to improve ability to get in and out of shower safely   Time 4   Period Weeks   Status New   PT LONG TERM GOAL #3   Title Patient will ascend/descend 4 stairs without rail assist independently without loss of balance to improve ability to get in/out of home   Time 4   Period Weeks   Status New               Plan - 11/04/15 1039    Clinical Impression Statement Patient continues to demonstrates less incoordination of movement with select exercises such as rock and reach and stepping backwards. Patient responds well to verbal and tactile cues to correct form and technique. Patient is able to catch mistakes in technique with incorrect positions and is able remember the start and  finish positions. Motor control of LE much improved.  Muscle fatigue but no major pain complaints.   Rehab Potential Good   PT Frequency 4x / week   PT Duration 4 weeks   PT Treatment/Interventions Balance training;Therapeutic exercise;Therapeutic activities   PT Next Visit Plan LSVT BIG   Consulted and Agree with Plan of Care Patient      Patient will benefit from skilled therapeutic intervention in order to improve the following deficits and impairments:  Decreased balance, Difficulty walking, Decreased coordination  Visit Diagnosis: Difficulty in walking, not elsewhere classified  Unsteadiness on feet     Problem List There are no active problems to display for this  patient. Alanson Puls, PT, DPT  [place], Minette [place]land S 11/04/2015, 10:40 AM  [place] MAIN [facility] SERVICES [address] [place], [place], [address] Phone: [phone]   Fax:  [phone]  Name: Barry Joyce MRN: [id number] Date of Birth: Apr 16, 1941

## 2015-11-05 ENCOUNTER — Ambulatory Visit: Payer: Commercial Managed Care - HMO | Admitting: Speech Pathology

## 2015-11-08 ENCOUNTER — Ambulatory Visit: Payer: Commercial Managed Care - HMO | Admitting: Speech Pathology

## 2015-11-08 ENCOUNTER — Encounter: Payer: Self-pay | Admitting: Speech Pathology

## 2015-11-08 ENCOUNTER — Ambulatory Visit: Payer: Commercial Managed Care - HMO | Admitting: Physical Therapy

## 2015-11-08 ENCOUNTER — Encounter: Payer: Self-pay | Admitting: Physical Therapy

## 2015-11-08 DIAGNOSIS — R2681 Unsteadiness on feet: Secondary | ICD-10-CM

## 2015-11-08 DIAGNOSIS — R490 Dysphonia: Secondary | ICD-10-CM | POA: Diagnosis not present

## 2015-11-08 DIAGNOSIS — R262 Difficulty in walking, not elsewhere classified: Secondary | ICD-10-CM

## 2015-11-08 NOTE — Therapy (Signed)
[place] MAIN [facility] SERVICES [address] [place], [place], [address] Phone: [phone]   Fax:  [phone]  Speech Language Pathology Treatment  Patient Details  Name: Barry CRILLEY MRN: [id number] Date of Birth: 09-18-1940 Referring Provider: Dr.Shah  Encounter Date: 11/08/2015      End of Session - 11/08/15 1337    Visit Number 6   Number of Visits 17   Date for SLP Re-Evaluation 12/01/15   SLP Start Time 1000   SLP Stop Time  1100   SLP Time Calculation (min) 60 min   Activity Tolerance Patient tolerated treatment well      Past Medical History  Diagnosis Date  . GERD (gastroesophageal reflux disease)   . Parkinson disease ([place])   . Sarcoidosis ([place])     History reviewed. No pertinent past surgical history.  There were no vitals filed for this visit.      Subjective Assessment - 11/08/15 1337    Subjective The patient reports that speech/voice changes due to Parkinson's; changes include hypophonia, hoarse vocal quality, monotone speech.   Currently in Pain? No/denies               ADULT SLP TREATMENT - 11/08/15 0001    General Information   Behavior/Cognition Alert;Cooperative;Pleasant mood   Treatment Provided   Treatment provided Cognitive-Linquistic   Pain Assessment   Pain Assessment No/denies pain   Cognitive-Linquistic Treatment   Treatment focused on Voice   Skilled Treatment Daily Task #1 (Maximum sustained "ah"): Average 15 seconds, 83 dB. Daily Task 2 (Maximum fundamental frequency range): Highs: 15 high pitched "ah" given mod cues. Lows: 15 low pitched "ah" given mod cues. Daily task #3 (Maximum speech loudness drill of functional phrases): Average 73 dB.  Hierarchal speech loudness drill: Read complex sentences, 72 dB. Homework: assignments completed.  Off the cuff remarks: average 69 dB, given reminders to be loud.      Assessment / Recommendations / Plan   Plan Continue with current plan of  care   Progression Toward Goals   Progression toward goals Progressing toward goals          SLP Education - 11/08/15 1337    Education provided Yes   Education Details LSVT-LOUD   Person(s) Educated Patient   Methods Explanation   Comprehension Verbalized understanding            SLP Long Term Goals - 10/27/15 1022    SLP LONG TERM GOAL #1   Title The patient will complete Daily Tasks (Maximum duration "ah", High/Lows, and Functional Phrases) at average loudness of 80 dB and with loud, good quality voice.    Time 4   Period Weeks   Status New   SLP LONG TERM GOAL #2   Title The patient will complete Hierarchal Speech Loudness reading drills (words/phrases, sentences, and paragraph) at average 75 dB and with loud, good quality voice.     Time 4   Period Weeks   Status New   SLP LONG TERM GOAL #3   Title The patient will complete homework daily.   Time 4   Period Weeks   Status New   SLP LONG TERM GOAL #4   Title The patient will participate in conversation, maintaining average loudness of 75 dB and loud, good quality voice.   Time 4   Period Weeks   Status New          Plan - 11/08/15 1337    Clinical Impression  Statement The patient is completing daily tasks and hierarchal speech drill tasks with loud, good quality voice given fewer SLP cues for vocal quality and loudness.     Speech Therapy Frequency 4x / week   Duration 4 weeks   Treatment/Interventions Other (comment)  LSVT-LOUD   Potential to Achieve Goals Good   Potential Considerations Ability to learn/carryover information;Co-morbidities;Cooperation/participation level;Medical prognosis;Previous level of function;Severity of impairments;Family/community support   SLP Home Exercise Plan LSVT-LOUD daily homework   Consulted and Agree with Plan of Care Patient      Patient will benefit from skilled therapeutic intervention in order to improve the following deficits and impairments:    Dysphonia    Problem List There are no active problems to display for this patient.  Leroy Sea, [place], [name] 11/08/2015, 1:39 PM  [place] MAIN [facility] SERVICES [address] [place], [place], [address] Phone: [phone]   Fax:  [phone]   Name: DOMINION EVERSON MRN: [id number] Date of Birth: 1940/07/14

## 2015-11-08 NOTE — Therapy (Signed)
[place] MAIN [facility] SERVICES [address] [place], [place], [address] Phone: [phone]   Fax:  [phone]  Physical Therapy Treatment  Patient Details  Name: Barry Joyce MRN: [id number] Date of Birth: 06-12-1941 Referring Provider: Manuella Ghazi  Encounter Date: 11/08/2015      PT End of Session - 11/08/15 1038    Visit Number 6   Number of Visits 17   Date for PT Re-Evaluation 11/26/15   PT Start Time 1100   PT Stop Time 1200   PT Time Calculation (min) 60 min   Equipment Utilized During Treatment Gait belt      Past Medical History  Diagnosis Date  . GERD (gastroesophageal reflux disease)   . Parkinson disease ([place])   . Sarcoidosis ([place])     History reviewed. No pertinent past surgical history.  There were no vitals filed for this visit.      Subjective Assessment - 11/08/15 1037    Subjective Patient is ready to begin LSVT BIG. He is doing his exercises.    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 No/denies      Patient seen for LSVT Daily Session Maximal Daily Exercises for facilitation/coordination of movement Sustained movements are designed to rescale the amplitude of movement output for generalization to daily functional activities .Performed as follows for 1 set of 10 repetitions each multidirectional sustained movements 1) Floor to ceiling 2) Side to side multidirectional Repetitive movements performed in standing and are designed to provide retraining effort needed for sustained muscle activation in tasks   3) Step and reach forward 4) Step and reach backwards 5) Step and reach sideways 6) Rock and reach forward/backward 7) Rock and reach sideways Sit to stand functional component task with supervision 5 reps and simulated activities for:                            PT Education - 11/08/15 1038    Education provided Yes   Education  Details LSVT BIG   Person(s) Educated Patient   Methods Explanation   Comprehension Verbalized understanding             PT Long Term Goals - 11/01/15 1247    PT LONG TERM GOAL #2   Title Patient will tolerate 5 seconds of single leg stance without loss of balance to improve ability to get in and out of shower safely   Time 4   Period Weeks   Status New   PT LONG TERM GOAL #3   Title Patient will ascend/descend 4 stairs without rail assist independently without loss of balance to improve ability to get in/out of home   Time 4   Period Weeks   Status New               Plan - 11/08/15 1041    Clinical Impression Statement Min cueing needed to appropriately perform LSVT tasks with leg, hand, and head position. Decreased coordination demonstrated requiring consistent verbal cueing to correct form   Rehab Potential Good   PT Frequency 4x / week   PT Duration 4 weeks   PT Treatment/Interventions Balance training;Therapeutic exercise;Therapeutic activities   PT Next Visit Plan LSVT BIG   Consulted and Agree with Plan of Care Patient      Patient will benefit from skilled therapeutic intervention in order to improve the following deficits and impairments:  Decreased balance, Difficulty walking, Decreased coordination  Visit Diagnosis: Difficulty in walking, not elsewhere classified  Unsteadiness on feet     Problem List There are no active problems to display for this patient. Alanson Puls, PT, DPT  [place], Minette Headland S 11/08/2015, 12:04 PM  [place] MAIN [facility] SERVICES [address] [place], [place], [address] Phone: [phone]   Fax:  [phone]  Name: Barry Joyce MRN: [id number] Date of Birth: Nov 02, 1940

## 2015-11-09 ENCOUNTER — Ambulatory Visit: Payer: Commercial Managed Care - HMO | Admitting: Speech Pathology

## 2015-11-09 ENCOUNTER — Encounter: Payer: Self-pay | Admitting: Physical Therapy

## 2015-11-09 ENCOUNTER — Ambulatory Visit: Payer: Commercial Managed Care - HMO | Admitting: Physical Therapy

## 2015-11-09 ENCOUNTER — Encounter: Payer: Self-pay | Admitting: Speech Pathology

## 2015-11-09 DIAGNOSIS — R262 Difficulty in walking, not elsewhere classified: Secondary | ICD-10-CM | POA: Diagnosis not present

## 2015-11-09 DIAGNOSIS — R490 Dysphonia: Secondary | ICD-10-CM

## 2015-11-09 DIAGNOSIS — R2681 Unsteadiness on feet: Secondary | ICD-10-CM | POA: Diagnosis not present

## 2015-11-09 NOTE — Therapy (Signed)
[place] MAIN [facility] SERVICES [address] [place], [place], [address] Phone: [phone]   Fax:  [phone]  Speech Language Pathology Treatment  Patient Details  Name: Barry Joyce MRN: [id number] Date of Birth: 1941-04-22 Referring Provider: Dr.Shah  Encounter Date: 11/09/2015      End of Session - 11/09/15 1159    Visit Number 7   Number of Visits 17   Date for SLP Re-Evaluation 12/01/15   SLP Start Time 1010   SLP Stop Time  1100   SLP Time Calculation (min) 50 min   Activity Tolerance Patient tolerated treatment well      Past Medical History  Diagnosis Date  . GERD (gastroesophageal reflux disease)   . Parkinson disease ([place])   . Sarcoidosis ([place])     History reviewed. No pertinent past surgical history.  There were no vitals filed for this visit.      Subjective Assessment - 11/09/15 1158    Subjective The patient feels that his voice is better in the PMs               ADULT SLP TREATMENT - 11/09/15 0001    General Information   Behavior/Cognition Alert;Cooperative;Pleasant mood   Treatment Provided   Treatment provided Cognitive-Linquistic   Pain Assessment   Pain Assessment No/denies pain   Cognitive-Linquistic Treatment   Treatment focused on Voice   Skilled Treatment Daily Task #1 (Maximum sustained "ah"): Average 15 seconds, 83 dB. Daily Task 2 (Maximum fundamental frequency range): Highs: 15 high pitched "ah" given mod cues. Lows: 15 low pitched "ah" given mod cues. Daily task #3 (Maximum speech loudness drill of functional phrases): Average 73 dB.  Hierarchal speech loudness drill: Read complex sentences, 72 dB. Homework: assignments completed.  Off the cuff remarks: average 69 dB, given reminders to be loud.      Assessment / Recommendations / Plan   Plan Continue with current plan of care   Progression Toward Goals   Progression toward goals Progressing toward goals          SLP  Education - 11/09/15 1158    Education provided Yes   Education Details LSVT-LOUD   Person(s) Educated Patient   Methods Explanation   Comprehension Verbalized understanding            SLP Long Term Goals - 10/27/15 1022    SLP LONG TERM GOAL #1   Title The patient will complete Daily Tasks (Maximum duration "ah", High/Lows, and Functional Phrases) at average loudness of 80 dB and with loud, good quality voice.    Time 4   Period Weeks   Status New   SLP LONG TERM GOAL #2   Title The patient will complete Hierarchal Speech Loudness reading drills (words/phrases, sentences, and paragraph) at average 75 dB and with loud, good quality voice.     Time 4   Period Weeks   Status New   SLP LONG TERM GOAL #3   Title The patient will complete homework daily.   Time 4   Period Weeks   Status New   SLP LONG TERM GOAL #4   Title The patient will participate in conversation, maintaining average loudness of 75 dB and loud, good quality voice.   Time 4   Period Weeks   Status New          Plan - 11/09/15 1159    Clinical Impression Statement The patient is completing daily tasks and hierarchal speech drill tasks  with loud, good quality voice given fewer SLP cues for vocal quality and loudness.     Speech Therapy Frequency 4x / week   Duration 4 weeks   Treatment/Interventions Other (comment)  LSVT-LOUD   Potential to Achieve Goals Good   Potential Considerations Ability to learn/carryover information;Co-morbidities;Cooperation/participation level;Medical prognosis;Previous level of function;Severity of impairments;Family/community support   SLP Home Exercise Plan LSVT-LOUD daily homework   Consulted and Agree with Plan of Care Patient      Patient will benefit from skilled therapeutic intervention in order to improve the following deficits and impairments:   Dysphonia    Problem List There are no active problems to display for this patient.  Leroy Sea, [place], [name] 11/09/2015, 12:00 PM  [place] MAIN [facility] SERVICES [address] [place], [place], [address] Phone: [phone]   Fax:  [phone]   Name: GARRELL LAVERS MRN: [id number] Date of Birth: [birth year]

## 2015-11-09 NOTE — Therapy (Signed)
[place] MAIN [facility] SERVICES [address] [place], [place], [address] Phone: [phone]   Fax:  [phone]  Physical Therapy Treatment  Patient Details  Name: Barry Joyce MRN: [id number] Date of Birth: 02/23/1941 Referring Provider: Manuella Ghazi  Encounter Date: 11/09/2015      PT End of Session - 11/09/15 1030    Visit Number 9   Number of Visits 17   Date for PT Re-Evaluation 11/26/15   PT Start Time 1100   PT Stop Time 1200   PT Time Calculation (min) 60 min   Equipment Utilized During Treatment Gait belt      Past Medical History  Diagnosis Date  . GERD (gastroesophageal reflux disease)   . Parkinson disease ([place])   . Sarcoidosis ([place])     History reviewed. No pertinent past surgical history.  There were no vitals filed for this visit.      Subjective Assessment - 11/09/15 1030    Subjective Patient is ready to begin LSVT BIG. He is doing his exercises.    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 No/denies      Patient seen for LSVT Daily Session Maximal Daily Exercises for facilitation/coordination of movement Sustained movements are designed to rescale the amplitude of movement output for generalization to daily functional activities .Performed as follows for 1 set of 10 repetitions each multidirectional sustained movements 1) Floor to ceiling 2) Side to side multidirectional Repetitive movements performed in standing and are designed to provide retraining effort needed for sustained muscle activation in tasks   3) Step and reach forward 4) Step and reach backwards 5) Step and reach sideways 6) Rock and reach forward/backward 7) Rock and reach sideways Sit to stand functional component task with supervision 5 reps and  simulated .activities for: tapping and ascending steps and reaching for objects low and high.  Ambulation 1000 feet x 5 with cognitive tasks and  recalling items.                            PT Education - 11/09/15 1030    Education provided Yes   Education Details LSVT BIG   Person(s) Educated Patient   Methods Explanation   Comprehension Verbalized understanding             PT Long Term Goals - 11/01/15 1247    PT LONG TERM GOAL #2   Title Patient will tolerate 5 seconds of single leg stance without loss of balance to improve ability to get in and out of shower safely   Time 4   Period Weeks   Status New   PT LONG TERM GOAL #3   Title Patient will ascend/descend 4 stairs without rail assist independently without loss of balance to improve ability to get in/out of home   Time 4   Period Weeks   Status New               Plan - 11/09/15 1031    Clinical Impression Statement  Cuing is needed to stretch the leg out in seated side reaching   Rehab Potential Good   PT Frequency 4x / week   PT Duration 4 weeks   PT Treatment/Interventions Balance training;Therapeutic exercise;Therapeutic activities   PT Next Visit Plan LSVT BIG   Consulted and Agree with Plan of Care Patient      Patient will benefit  from skilled therapeutic intervention in order to improve the following deficits and impairments:  Decreased balance, Difficulty walking, Decreased coordination  Visit Diagnosis: Difficulty in walking, not elsewhere classified  Unsteadiness on feet     Problem List There are no active problems to display for this patient.  Alanson Puls, PT, DPT [place], Minette Headland S 11/09/2015, 10:32 AM  [place] MAIN [facility] SERVICES [address] [place], [place], [address] Phone: [phone]   Fax:  [phone]  Name: Barry Joyce MRN: [id number] Date of Birth: [birth year]

## 2015-11-10 ENCOUNTER — Encounter: Payer: Self-pay | Admitting: Physical Therapy

## 2015-11-10 ENCOUNTER — Ambulatory Visit: Payer: Commercial Managed Care - HMO | Admitting: Physical Therapy

## 2015-11-10 ENCOUNTER — Ambulatory Visit: Payer: Commercial Managed Care - HMO | Admitting: Speech Pathology

## 2015-11-10 ENCOUNTER — Encounter: Payer: Self-pay | Admitting: Speech Pathology

## 2015-11-10 DIAGNOSIS — R262 Difficulty in walking, not elsewhere classified: Secondary | ICD-10-CM

## 2015-11-10 DIAGNOSIS — R490 Dysphonia: Secondary | ICD-10-CM

## 2015-11-10 DIAGNOSIS — R2681 Unsteadiness on feet: Secondary | ICD-10-CM | POA: Diagnosis not present

## 2015-11-10 NOTE — Therapy (Signed)
[place] MAIN [facility] SERVICES [address] [place], [place], [address] Phone: [phone]   Fax:  [phone]  Physical Therapy Treatment  Patient Details  Name: Barry Joyce MRN: [id number] Date of Birth: 1941/06/01 Referring Provider: Manuella Ghazi  Encounter Date: 11/10/2015      PT End of Session - 11/10/15 1005    Visit Number 8   Number of Visits 17   PT Start Time 1100   PT Stop Time 1200   PT Time Calculation (min) 60 min   Activity Tolerance Patient tolerated treatment well      Past Medical History  Diagnosis Date  . GERD (gastroesophageal reflux disease)   . Parkinson disease ([place])   . Sarcoidosis ([place])     History reviewed. No pertinent past surgical history.  There were no vitals filed for this visit.      Subjective Assessment - 11/10/15 1004    Subjective Patient is ready to begin LSVT BIG. He is doing his exercises.    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, hand surgery, lundrens and trigger finger      Patient seen for LSVT Daily Session Maximal Daily Exercises for facilitation/coordination of movement Sustained movements are designed to rescale the amplitude of movement output for generalization to daily functional activities .Performed as follows for 1 set of 10 repetitions each multidirectional sustained movements 1) Floor to ceiling 2) Side to side multidirectional Repetitive movements performed in standing and are designed to provide retraining effort needed for sustained muscle activation in tasks   3) Step and reach forward 4) Step and reach backwards 5) Step and reach sideways 6) Rock and reach forward/backward 7) Rock and reach sideways Sit to stand functional component task with supervision 5 reps and simulated activities for: dynamic standing balance for reaching up and down and for stepping and ascending/descending steps. Min cueing needed to appropriately perform LSVT tasks with leg, hand, and head  position. Decreased coordination demonstrated requiring consistent verbal cueing to correct form. Cognitive understanding of task was delayed. Patient continues to demonstrate some in coordination of movement with select exercises such as rock and reach and stepping backwards. Patient responds well to verbal and tactile cues to correct form and technique.  CGA to SBA for safety with activities.  Uses to increase intensity and amplitude of movements throughout session                           PT Education - 11/10/15 1004    Education provided Yes   Education Details LSVT BIG   Person(s) Educated Patient   Methods Explanation   Comprehension Verbalized understanding             PT Long Term Goals - 11/01/15 1247    PT LONG TERM GOAL #2   Title Patient will tolerate 5 seconds of single leg stance without loss of balance to improve ability to get in and out of shower safely   Time 4   Period Weeks   Status New   PT LONG TERM GOAL #3   Title Patient will ascend/descend 4 stairs without rail assist independently without loss of balance to improve ability to get in/out of home   Time 4   Period Weeks   Status New               Plan - 11/10/15 1108    Clinical Impression Statement Min cueing needed to appropriately perform LSVT  tasks with leg, hand, and head position. Decreased coordination demonstrated requiring consistent verbal cueing to correct form   Rehab Potential Good   PT Frequency 4x / week   PT Duration 4 weeks   PT Treatment/Interventions Balance training;Therapeutic exercise;Therapeutic activities   PT Next Visit Plan LSVT BIG   Consulted and Agree with Plan of Care Patient      Patient will benefit from skilled therapeutic intervention in order to improve the following deficits and impairments:  Decreased balance, Difficulty walking, Decreased coordination  Visit Diagnosis: Difficulty in walking, not elsewhere classified  Unsteadiness  on feet     Problem List There are no active problems to display for this patient.  Alanson Puls, PT, DPT [place], Minette Headland S 11/10/2015, 11:10 AM  [place] MAIN [facility] SERVICES [address] [place], [place], [address] Phone: [phone]   Fax:  [phone]  Name: Barry Joyce MRN: [id number] Date of Birth: 1941-06-09

## 2015-11-10 NOTE — Therapy (Signed)
[place] MAIN [facility] SERVICES [address] [place], [place], [address] Phone: [phone]   Fax:  [phone]  Speech Language Pathology Treatment  Patient Details  Name: Barry Joyce MRN: [id number] Date of Birth: [birth year] Referring Provider: Dr.Shah  Encounter Date: 11/10/2015      End of Session - 11/10/15 1308    Visit Number 8   Number of Visits 17   Date for SLP Re-Evaluation 12/01/15   SLP Start Time 1000   SLP Stop Time  1100   SLP Time Calculation (min) 60 min   Activity Tolerance Patient tolerated treatment well      Past Medical History  Diagnosis Date  . GERD (gastroesophageal reflux disease)   . Parkinson disease ([place])   . Sarcoidosis ([place])     History reviewed. No pertinent past surgical history.  There were no vitals filed for this visit.      Subjective Assessment - 11/10/15 1308    Subjective The patient feels that his voice is better in the PMs   Currently in Pain? No/denies               ADULT SLP TREATMENT - 11/10/15 0001    General Information   Behavior/Cognition Alert;Cooperative;Pleasant mood   Treatment Provided   Treatment provided Cognitive-Linquistic   Pain Assessment   Pain Assessment No/denies pain   Cognitive-Linquistic Treatment   Treatment focused on Voice   Skilled Treatment Daily Task #1 (Maximum sustained "ah"): Average 15 seconds, 83 dB. Daily Task 2 (Maximum fundamental frequency range): Highs: 15 high pitched "ah" given mod cues. Lows: 15 low pitched "ah" given mod cues. Daily task #3 (Maximum speech loudness drill of functional phrases): Average 73 dB.  Hierarchal speech loudness drill: Read complex sentences, 72 dB. Homework: assignments completed.  Off the cuff remarks: average 71 dB, given fewer reminders to be loud.      Assessment / Recommendations / Plan   Plan Continue with current plan of care   Progression Toward Goals   Progression toward goals Progressing  toward goals          SLP Education - 11/10/15 1308    Education provided Yes   Education Details LSVT-LOUD   Person(s) Educated Patient   Methods Explanation   Comprehension Verbalized understanding            SLP Long Term Goals - 10/27/15 1022    SLP LONG TERM GOAL #1   Title The patient will complete Daily Tasks (Maximum duration "ah", High/Lows, and Functional Phrases) at average loudness of 80 dB and with loud, good quality voice.    Time 4   Period Weeks   Status New   SLP LONG TERM GOAL #2   Title The patient will complete Hierarchal Speech Loudness reading drills (words/phrases, sentences, and paragraph) at average 75 dB and with loud, good quality voice.     Time 4   Period Weeks   Status New   SLP LONG TERM GOAL #3   Title The patient will complete homework daily.   Time 4   Period Weeks   Status New   SLP LONG TERM GOAL #4   Title The patient will participate in conversation, maintaining average loudness of 75 dB and loud, good quality voice.   Time 4   Period Weeks   Status New          Plan - 11/10/15 1308    Clinical Impression Statement The patient is completing  daily tasks and hierarchal speech drill tasks with loud, good quality voice given fewer SLP cues for vocal quality and loudness.     Speech Therapy Frequency 4x / week   Duration 4 weeks   Treatment/Interventions Other (comment)  LSVT-LOUD   Potential to Achieve Goals Good   Potential Considerations Ability to learn/carryover information;Co-morbidities;Cooperation/participation level;Medical prognosis;Previous level of function;Severity of impairments;Family/community support   SLP Home Exercise Plan LSVT-LOUD daily homework   Consulted and Agree with Plan of Care Patient      Patient will benefit from skilled therapeutic intervention in order to improve the following deficits and impairments:   Dysphonia    Problem List There are no active problems to display for this  patient.  Leroy Sea, [place], [name] 11/10/2015, 1:09 PM  [place] MAIN [facility] SERVICES [address] [place], [place], [address] Phone: [phone]   Fax:  [phone]   Name: MARKISE TREMAIN MRN: [id number] Date of Birth: 1941-03-05

## 2015-11-11 ENCOUNTER — Encounter: Payer: Self-pay | Admitting: Speech Pathology

## 2015-11-11 ENCOUNTER — Ambulatory Visit: Payer: Commercial Managed Care - HMO | Admitting: Physical Therapy

## 2015-11-11 ENCOUNTER — Encounter: Payer: Self-pay | Admitting: Physical Therapy

## 2015-11-11 ENCOUNTER — Ambulatory Visit: Payer: Commercial Managed Care - HMO | Admitting: Speech Pathology

## 2015-11-11 DIAGNOSIS — R262 Difficulty in walking, not elsewhere classified: Secondary | ICD-10-CM

## 2015-11-11 DIAGNOSIS — R490 Dysphonia: Secondary | ICD-10-CM

## 2015-11-11 DIAGNOSIS — R2681 Unsteadiness on feet: Secondary | ICD-10-CM | POA: Diagnosis not present

## 2015-11-11 NOTE — Therapy (Signed)
[place] MAIN [facility] SERVICES [address] [place], [place], [address] Phone: [phone]   Fax:  [phone]  Speech Language Pathology Treatment  Patient Details  Name: Barry Joyce MRN: [id number] Date of Birth: 1940/11/26 Referring Provider: Dr.Shah  Encounter Date: 11/11/2015      End of Session - 11/11/15 1642    Visit Number 9   Number of Visits 17   Date for SLP Re-Evaluation 12/01/15   SLP Start Time 1010   SLP Stop Time  1100   SLP Time Calculation (min) 50 min   Activity Tolerance Patient tolerated treatment well      Past Medical History  Diagnosis Date  . GERD (gastroesophageal reflux disease)   . Parkinson disease ([place])   . Sarcoidosis ([place])     History reviewed. No pertinent past surgical history.  There were no vitals filed for this visit.      Subjective Assessment - 11/11/15 1641    Subjective The patient feels that his voice is better in the PMs   Currently in Pain? No/denies               ADULT SLP TREATMENT - 11/11/15 0001    General Information   Behavior/Cognition Alert;Cooperative;Pleasant mood   Treatment Provided   Treatment provided Cognitive-Linquistic   Pain Assessment   Pain Assessment No/denies pain   Cognitive-Linquistic Treatment   Treatment focused on Voice   Skilled Treatment Daily Task #1 (Maximum sustained "ah"): Average 15 seconds, 83 dB. Daily Task 2 (Maximum fundamental frequency range): Highs: 15 high pitched "ah" given mod cues. Lows: 15 low pitched "ah" given mod cues. Daily task #3 (Maximum speech loudness drill of functional phrases): Average 73 dB.  Hierarchal speech loudness drill: Read complex sentences, 72 dB. Generate phrases given picture stimulus, 73 dB.  Homework: assignments completed.  Off the cuff remarks: average 71 dB, given fewer reminders to be loud.      Assessment / Recommendations / Plan   Plan Continue with current plan of care   Progression  Toward Goals   Progression toward goals Progressing toward goals          SLP Education - 11/11/15 1641    Education Details LSVT-LOUD   Person(s) Educated Patient   Methods Explanation   Comprehension Verbalized understanding            SLP Long Term Goals - 10/27/15 1022    SLP LONG TERM GOAL #1   Title The patient will complete Daily Tasks (Maximum duration "ah", High/Lows, and Functional Phrases) at average loudness of 80 dB and with loud, good quality voice.    Time 4   Period Weeks   Status New   SLP LONG TERM GOAL #2   Title The patient will complete Hierarchal Speech Loudness reading drills (words/phrases, sentences, and paragraph) at average 75 dB and with loud, good quality voice.     Time 4   Period Weeks   Status New   SLP LONG TERM GOAL #3   Title The patient will complete homework daily.   Time 4   Period Weeks   Status New   SLP LONG TERM GOAL #4   Title The patient will participate in conversation, maintaining average loudness of 75 dB and loud, good quality voice.   Time 4   Period Weeks   Status New          Plan - 11/11/15 1642    Clinical Impression Statement The  patient is completing daily tasks and hierarchal speech drill tasks with loud, good quality voice given fewer SLP cues for vocal quality and loudness.     Speech Therapy Frequency 4x / week   Duration 4 weeks   Treatment/Interventions Other (comment)  LSVT-LOUD   Potential to Achieve Goals Good   Potential Considerations Ability to learn/carryover information;Co-morbidities;Cooperation/participation level;Medical prognosis;Previous level of function;Severity of impairments;Family/community support   SLP Home Exercise Plan LSVT-LOUD daily homework   Consulted and Agree with Plan of Care Patient      Patient will benefit from skilled therapeutic intervention in order to improve the following deficits and impairments:   Dysphonia    Problem List There are no active problems to  display for this patient.  Leroy Sea, MS/CCC- SLP  Lou Miner 11/11/2015, 4:43 PM  [place] MAIN [facility] SERVICES [address] [place], [place], [address] Phone: [phone]   Fax:  [phone]   Name: Barry Joyce MRN: [id number] Date of Birth: 27-Sep-1940

## 2015-11-11 NOTE — Therapy (Signed)
[place] MAIN [facility] SERVICES [address] [place], [place], [address] Phone: [phone]   Fax:  [phone]  Physical Therapy Treatment  Patient Details  Name: Barry Joyce MRN: [id number] Date of Birth: [birth year] Referring Provider: Manuella Ghazi  Encounter Date: 11/11/2015      PT End of Session - 11/11/15 1049    Visit Number [id number]   Number of Visits 17   PT Start Time 1100   PT Stop Time 1155   PT Time Calculation (min) 55 min   Activity Tolerance Patient tolerated treatment well      Past Medical History  Diagnosis Date  . GERD (gastroesophageal reflux disease)   . Parkinson disease ([place])   . Sarcoidosis ([place])     History reviewed. No pertinent past surgical history.  There were no vitals filed for this visit.      Subjective Assessment - 11/11/15 1049    Subjective Patient is ready to begin LSVT BIG. He is doing his exercises.    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 No/denies    Patient seen for LSVT Daily Session Maximal Daily Exercises for facilitation/coordination of movement Sustained movements are designed to rescale the amplitude of movement output for generalization to daily functional activities .Performed as follows for 1 set of 10 repetitions each multidirectional sustained movements 1) Floor to ceiling 2) Side to side multidirectional Repetitive movements performed in standing and are designed to provide retraining effort needed for sustained muscle activation in tasks   3) Step and reach forward 4) Step and reach backwards 5) Step and reach sideways 6) Rock and reach forward/backward 7) Rock and reach sideways Sit to stand functional component task with supervision 5 reps and simulated activities for: dynamic standing balance with reaching fwd and low. Patient improved in his outcome measures and is making progress towards his goals.                 Expand All Collapse All                                          Date  .    .    .    Marland Kitchen                  OUTCOME MEASURES: TEST Outcome Interpretation  5 times sit<>stand 9.50 sec >60 yo, >15 sec indicates increased risk for falls  10 meter walk test  2.12 m/s <1.0 m/s indicates increased risk for falls; limited community ambulator  Timed up and Go  5.10 sec <14 sec indicates increased risk for falls  6 minute walk test  1620 Feet 1000 feet is community Water quality scientist 56/56 <36/56 (100% risk for falls), 37-45 (80% risk for falls); 46-51 (>50% risk for falls); 52-55 (lower risk <25% of falls)  9 Hole Peg Test L: 27.46 R: 24.42                                    PT Education - 11/11/15 1049    Education provided Yes   Education Details LSVT BIG   Person(s) Educated Patient   Methods Explanation   Comprehension Verbalized understanding  PT Long Term Goals - 12-04-15 1051    PT LONG TERM GOAL #1   Title Patient will be independent in home exercise program to improve strength/mobility for better functional independence with ADLs   Time 4   Period Weeks   Status Partially Met   PT LONG TERM GOAL #2   Title Patient will tolerate 5 seconds of single leg stance without loss of balance to improve ability to get in and out of shower safely   Time 4   Period Weeks   Status Partially Met   PT LONG TERM GOAL #3   Title Patient will ascend/descend 4 stairs without rail assist independently without loss of balance to improve ability to get in/out of home   Time 4   Period Weeks   Status Partially Met               Plan - 2015/12/04 1050    Clinical Impression Statement Patient continues to demonstrates less incoordination of movement with select exercises such as rock and reach and stepping backwards.  Patient responds well to verbal and tactile cues to correct form and technique. Patient is able to catch mistakes in technique with incorrect positions and is able remember the start and finish positions. Motor control of LE much improved.  Muscle fatigue but no major pain complaints.   Rehab Potential Good   PT Frequency 4x / week   PT Duration 4 weeks   PT Treatment/Interventions Balance training;Therapeutic exercise;Therapeutic activities   PT Next Visit Plan LSVT BIG   Consulted and Agree with Plan of Care Patient      Patient will benefit from skilled therapeutic intervention in order to improve the following deficits and impairments:  Decreased balance, Difficulty walking, Decreased coordination  Visit Diagnosis: Difficulty in walking, not elsewhere classified  Unsteadiness on feet       G-Codes - 2015-12-04 1051    Functional Assessment Tool Used 6 mw, berg balance   Mobility: Walking and Moving Around Current Status (X9024) At least 20 percent but less than 40 percent impaired, limited or restricted   Mobility: Walking and Moving Around Goal Status (O9735) At least 1 percent but less than 20 percent impaired, limited or restricted      Problem List There are no active problems to display for this patient.  Alanson Puls, PT, DPT [place], Minette Headland S 2015-12-04, 11:35 AM  [place] MAIN [facility] SERVICES [address] [place], [place], [address] Phone: [phone]   Fax:  [phone]  Name: Barry Joyce MRN: [id number] Date of Birth: 02-15-1941

## 2015-11-12 ENCOUNTER — Ambulatory Visit: Payer: Commercial Managed Care - HMO | Admitting: Speech Pathology

## 2015-11-12 DIAGNOSIS — G20 Parkinson's disease: Secondary | ICD-10-CM | POA: Diagnosis not present

## 2015-11-15 ENCOUNTER — Encounter: Payer: Self-pay | Admitting: Physical Therapy

## 2015-11-15 ENCOUNTER — Ambulatory Visit: Payer: Commercial Managed Care - HMO | Admitting: Physical Therapy

## 2015-11-15 ENCOUNTER — Ambulatory Visit: Payer: Commercial Managed Care - HMO | Admitting: Speech Pathology

## 2015-11-15 ENCOUNTER — Encounter: Payer: Self-pay | Admitting: Speech Pathology

## 2015-11-15 DIAGNOSIS — R262 Difficulty in walking, not elsewhere classified: Secondary | ICD-10-CM

## 2015-11-15 DIAGNOSIS — R490 Dysphonia: Secondary | ICD-10-CM

## 2015-11-15 DIAGNOSIS — R2681 Unsteadiness on feet: Secondary | ICD-10-CM | POA: Diagnosis not present

## 2015-11-15 NOTE — Therapy (Signed)
[place] MAIN [facility] SERVICES [address] [place], [place], [address] Phone: [phone]   Fax:  [phone]  Speech Language Pathology Treatment/Progress Note  Patient Details  Name: Barry Joyce MRN: [id number] Date of Birth: July 31, 1940 Referring Provider: Dr.Shah  Encounter Date: 11/15/2015      End of Session - 11/15/15 1338    Visit Number 10   Number of Visits 17   Date for SLP Re-Evaluation 12/01/15   SLP Start Time 1000   SLP Stop Time  1100   SLP Time Calculation (min) 60 min   Activity Tolerance Patient tolerated treatment well      Past Medical History  Diagnosis Date  . GERD (gastroesophageal reflux disease)   . Parkinson disease ([place])   . Sarcoidosis ([place])     History reviewed. No pertinent past surgical history.  There were no vitals filed for this visit.      Subjective Assessment - 11/15/15 1337    Subjective The patient feels that his voice is better in the PMs   Currently in Pain? No/denies               ADULT SLP TREATMENT - 11/15/15 0001    General Information   Behavior/Cognition Alert;Cooperative;Pleasant mood   Treatment Provided   Treatment provided Cognitive-Linquistic   Pain Assessment   Pain Assessment No/denies pain   Cognitive-Linquistic Treatment   Treatment focused on Voice   Skilled Treatment Daily Task #1 (Maximum sustained "ah"): Average 15 seconds, 83 dB. Daily Task 2 (Maximum fundamental frequency range): Highs: 15 high pitched "ah" given min cues. Lows: 15 low pitched "ah" given min cues. Daily task #3 (Maximum speech loudness drill of functional phrases): Average 75 dB.  Hierarchal speech loudness drill: Read complex sentences, 73 dB. Generate phrases given picture stimulus, 73 dB.  Homework: assignments completed.  Off the cuff remarks: average 71 dB, given fewer reminders to be loud.      Assessment / Recommendations / Plan   Plan Continue with current plan of care   Progression Toward Goals   Progression toward goals Progressing toward goals          SLP Education - 11/15/15 1337    Education provided Yes   Education Details LSVT-LOUD   Person(s) Educated Patient   Methods Explanation   Comprehension Verbalized understanding            SLP Long Term Goals - 11/15/15 1338    SLP LONG TERM GOAL #1   Title The patient will complete Daily Tasks (Maximum duration "ah", High/Lows, and Functional Phrases) at average loudness of 80 dB and with loud, good quality voice.    Time 4   Period Weeks   Status Partially Met   SLP LONG TERM GOAL #2   Title The patient will complete Hierarchal Speech Loudness reading drills (words/phrases, sentences, and paragraph) at average 75 dB and with loud, good quality voice.     Time 4   Period Weeks   Status Partially Met   SLP LONG TERM GOAL #3   Title The patient will complete homework daily.   Time 4   Period Weeks   Status On-going   SLP LONG TERM GOAL #4   Title The patient will participate in conversation, maintaining average loudness of 75 dB and loud, good quality voice.   Time 4   Period Weeks   Status Partially Met          Plan - 11/15/15  [place]    Clinical Impression Statement The patient is completing daily tasks and hierarchal speech drill tasks with loud, good quality voice given fewer SLP cues for vocal quality and loudness.     Speech Therapy Frequency 4x / week   Duration 4 weeks   Treatment/Interventions Other (comment)  LSVT-LOUD   Potential to Achieve Goals Good   Potential Considerations Ability to learn/carryover information;Co-morbidities;Cooperation/participation level;Medical prognosis;Previous level of function;Severity of impairments;Family/community support   SLP Home Exercise Plan LSVT-LOUD daily homework   Consulted and Agree with Plan of Care Patient      Patient will benefit from skilled therapeutic intervention in order to improve the following deficits and  impairments:   Dysphonia      G-Codes - 12/03/15 1339    Functional Assessment Tool Used LSVT-LOUD protocol, clinical jegment   Functional Limitations Voice   Voice Current Status (G9171) At least 20 percent but less than 40 percent impaired, limited or restricted   Voice Goal Status (G9172) At least 1 percent but less than 20 percent impaired, limited or restricted      Problem List There are no active problems to display for this patient.  Leroy Sea, MS/CCC- SLP  Lou Miner Dec 03, 2015, 1:40 PM  [place] MAIN [facility] SERVICES [address] [place], [place], [address] Phone: [phone]   Fax:  [phone]   Name: Barry Joyce MRN: [id number] Date of Birth: April 15, 1941

## 2015-11-15 NOTE — Therapy (Signed)
[place] MAIN [facility] SERVICES [address] [place], [place], [address] Phone: [phone]   Fax:  [phone]  Physical Therapy Treatment  Patient Details  Name: Barry Joyce MRN: [id number] Date of Birth: Dec 12, 1940 Referring Provider: Manuella Ghazi  Encounter Date: 11/15/2015      PT End of Session - 11/15/15 1121    Visit Number 12   Number of Visits 17   PT Start Time 1100   PT Stop Time 1200   PT Time Calculation (min) 60 min   Activity Tolerance Patient tolerated treatment well      Past Medical History  Diagnosis Date  . GERD (gastroesophageal reflux disease)   . Parkinson disease ([place])   . Sarcoidosis ([place])     History reviewed. No pertinent past surgical history.  There were no vitals filed for this visit.      Subjective Assessment - 11/15/15 1120    Subjective Patient is ready to begin LSVT BIG. He is doing his exercises.    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 No/denies      Patient seen for LSVT Daily Session Maximal Daily Exercises for facilitation/coordination of movement Sustained movements are designed to rescale the amplitude of movement output for generalization to daily functional activities .Performed as follows for 1 set of 10 repetitions each multidirectional sustained movements 1) Floor to ceiling 2) Side to side multidirectional Repetitive movements performed in standing and are designed to provide retraining effort needed for sustained muscle activation in tasks   3) Step and reach forward 4) Step and reach backwards 5) Step and reach sideways 6) Rock and reach forward/backward 7) Rock and reach sideways Sit to stand functional component task with supervision 5 reps and  simulated .activities for dynamic standing activities with reaching fwd/high and low and stepping and tapping steps.  Continues to have balance deficits typical with diagnosis.  Patient performs intermediate level exercises without pain behaviors and needs verbal cuing for postural alignment and head positioning.                            PT Education - 11/15/15 1120    Education Details LSVT BIG   Person(s) Educated Patient   Methods Explanation   Comprehension Verbalized understanding             PT Long Term Goals - 11/11/15 1051    PT LONG TERM GOAL #1   Title Patient will be independent in home exercise program to improve strength/mobility for better functional independence with ADLs   Time 4   Period Weeks   Status Partially Met   PT LONG TERM GOAL #2   Title Patient will tolerate 5 seconds of single leg stance without loss of balance to improve ability to get in and out of shower safely   Time 4   Period Weeks   Status Partially Met   PT LONG TERM GOAL #3   Title Patient will ascend/descend 4 stairs without rail assist independently without loss of balance to improve ability to get in/out of home   Time 4   Period Weeks   Status Partially Met               Plan - 11/15/15 1122    Clinical Impression Statement Min cueing needed to appropriately perform LSVT tasks with leg, hand, and head position. Decreased coordination demonstrated requiring  consistent verbal cueing to correct form. Cognitive understanding of task was delayed. Patient continues to demonstrate some in coordination of movement with select exercises such as rock and reach and stepping backwards. Patient responds well to verbal and tactile cues to correct form and technique.  CGA to SBA for safety with activities.  Uses to increase intensity and amplitude of movements throughout session   Rehab Potential Good   PT Frequency 4x / week   PT Duration 4 weeks   PT Treatment/Interventions Balance training;Therapeutic exercise;Therapeutic activities   PT Next Visit Plan LSVT BIG   Consulted and Agree with Plan of Care Patient      Patient will benefit  from skilled therapeutic intervention in order to improve the following deficits and impairments:  Decreased balance, Difficulty walking, Decreased coordination  Visit Diagnosis: Difficulty in walking, not elsewhere classified  Unsteadiness on feet     Problem List There are no active problems to display for this patient.  Alanson Puls, PT, DPT [place], Minette Headland S 11/15/2015, 11:23 AM  [place] MAIN [facility] SERVICES [address] [place], [place], [address] Phone: [phone]   Fax:  [phone]  Name: Barry Joyce MRN: [id number] Date of Birth: 09-29-1940

## 2015-11-16 ENCOUNTER — Encounter: Payer: Self-pay | Admitting: Speech Pathology

## 2015-11-16 ENCOUNTER — Encounter: Payer: Self-pay | Admitting: Physical Therapy

## 2015-11-16 ENCOUNTER — Ambulatory Visit: Payer: Commercial Managed Care - HMO | Admitting: Speech Pathology

## 2015-11-16 ENCOUNTER — Ambulatory Visit: Payer: Commercial Managed Care - HMO | Admitting: Physical Therapy

## 2015-11-16 DIAGNOSIS — R490 Dysphonia: Secondary | ICD-10-CM

## 2015-11-16 DIAGNOSIS — R2681 Unsteadiness on feet: Secondary | ICD-10-CM | POA: Diagnosis not present

## 2015-11-16 DIAGNOSIS — R262 Difficulty in walking, not elsewhere classified: Secondary | ICD-10-CM

## 2015-11-16 NOTE — Therapy (Signed)
[place] MAIN [facility] SERVICES [address] [place], [place], [address] Phone: [phone]   Fax:  [phone]  Physical Therapy Treatment  Patient Details  Name: Barry Joyce MRN: [id number] Date of Birth: 12-Dec-1940 Referring Provider: Manuella Ghazi  Encounter Date: 11/16/2015      PT End of Session - 11/16/15 1048    Visit Number 13   Number of Visits 17   PT Start Time 1100   PT Stop Time 1200   PT Time Calculation (min) 60 min   Activity Tolerance Patient tolerated treatment well      Past Medical History  Diagnosis Date  . GERD (gastroesophageal reflux disease)   . Parkinson disease ([place])   . Sarcoidosis ([place])     History reviewed. No pertinent past surgical history.  There were no vitals filed for this visit.      Subjective Assessment - 11/16/15 1103    Subjective Patient is ready to begin LSVT BIG. He is doing his exercises.    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 No/denies      Patient seen for LSVT Daily Session Maximal Daily Exercises for facilitation/coordination of movement Sustained movements are designed to rescale the amplitude of movement output for generalization to daily functional activities .Performed as follows for 1 set of 10 repetitions each multidirectional sustained movements 1) Floor to ceiling 2) Side to side multidirectional Repetitive movements performed in standing and are designed to provide retraining effort needed for sustained muscle activation in tasks   3) Step and reach forward 4) Step and reach backwards 5) Step and reach sideways 6) Rock and reach forward/backward 7) Rock and reach sideways Sit to stand functional component task with supervision 5 reps and simulated activities for: stepping up and down steps/tapping on single leg/reaching with cones low on 1/2 foam to simulate high level balance activities.                              PT Education - 11/15/15 1120    Education Details LSVT BIG   Person(s) Educated Patient   Methods Explanation   Comprehension Verbalized understanding             PT Long Term Goals - 11/11/15 1051    PT LONG TERM GOAL #1   Title Patient will be independent in home exercise program to improve strength/mobility for better functional independence with ADLs   Time 4   Period Weeks   Status Partially Met   PT LONG TERM GOAL #2   Title Patient will tolerate 5 seconds of single leg stance without loss of balance to improve ability to get in and out of shower safely   Time 4   Period Weeks   Status Partially Met   PT LONG TERM GOAL #3   Title Patient will ascend/descend 4 stairs without rail assist independently without loss of balance to improve ability to get in/out of home   Time 4   Period Weeks   Status Partially Met               Plan - 11/16/15 1104    Clinical Impression Statement CGA to SBA for safety with activities.  Uses to increase intensity and amplitude of movements throughout session.pt especially has difficulty with opening his hands up towards the ceiling with multiple exercises and with extending his leg during  seated side to side multi-directional reaching activity.    Rehab Potential Good   PT Frequency 4x / week   PT Duration 4 weeks   PT Treatment/Interventions Balance training;Therapeutic exercise;Therapeutic activities   PT Next Visit Plan LSVT BIG   Consulted and Agree with Plan of Care Patient      Patient will benefit from skilled therapeutic intervention in order to improve the following deficits and impairments:  Decreased balance, Difficulty walking, Decreased coordination  Visit Diagnosis: Difficulty in walking, not elsewhere classified  Unsteadiness on feet     Problem List There are no active problems to display for this patient.  Alanson Puls, PT, DPT [place],  Minette Headland S 11/16/2015, 11:04 AM  [place] MAIN [facility] SERVICES [address] [place], [place], [address] Phone: [phone]   Fax:  [phone]  Name: Barry Joyce MRN: [id number] Date of Birth: Mar 03, 1941

## 2015-11-16 NOTE — Therapy (Signed)
[place] MAIN [facility] SERVICES [address] [place], [place], [address] Phone: [phone]   Fax:  [phone]  Speech Language Pathology Treatment  Patient Details  Name: Barry Joyce MRN: [id number] Date of Birth: [birth year] Referring Provider: Dr.Shah  Encounter Date: 11/16/2015      End of Session - 11/16/15 1723    Visit Number 11   Number of Visits 17   Date for SLP Re-Evaluation 12/01/15   SLP Start Time 1000   SLP Stop Time  1100   SLP Time Calculation (min) 60 min   Activity Tolerance Patient tolerated treatment well      Past Medical History  Diagnosis Date  . GERD (gastroesophageal reflux disease)   . Parkinson disease ([place])   . Sarcoidosis ([place])     History reviewed. No pertinent past surgical history.  There were no vitals filed for this visit.      Subjective Assessment - 11/16/15 1717    Subjective The patient feels that his voice is better in the PMs   Currently in Pain? No/denies               ADULT SLP TREATMENT - 11/16/15 0001    General Information   Behavior/Cognition Alert;Cooperative;Pleasant mood   Treatment Provided   Treatment provided Cognitive-Linquistic   Pain Assessment   Pain Assessment No/denies pain   Cognitive-Linquistic Treatment   Treatment focused on Voice   Skilled Treatment Daily Task #1 (Maximum sustained "ah"): Average 15 seconds, 83 dB. Daily Task 2 (Maximum fundamental frequency range): Highs: 15 high pitched "ah" given min cues. Lows: 15 low pitched "ah" given min cues. Daily task #3 (Maximum speech loudness drill of functional phrases): Average 75 dB.  Hierarchal speech loudness drill: Read complex sentences, 73 dB. Generate phrases given picture stimulus, 73 dB.  Homework: assignments completed.  Off the cuff remarks: average 71 dB, given fewer reminders to be loud.      Assessment / Recommendations / Plan   Plan Continue with current plan of care   Progression  Toward Goals   Progression toward goals Progressing toward goals          SLP Education - 11/16/15 1723    Education provided Yes   Education Details LSVT-LOUD   Person(s) Educated Patient   Methods Explanation   Comprehension Verbalized understanding            SLP Long Term Goals - 11/15/15 1338    SLP LONG TERM GOAL #1   Title The patient will complete Daily Tasks (Maximum duration "ah", High/Lows, and Functional Phrases) at average loudness of 80 dB and with loud, good quality voice.    Time 4   Period Weeks   Status Partially Met   SLP LONG TERM GOAL #2   Title The patient will complete Hierarchal Speech Loudness reading drills (words/phrases, sentences, and paragraph) at average 75 dB and with loud, good quality voice.     Time 4   Period Weeks   Status Partially Met   SLP LONG TERM GOAL #3   Title The patient will complete homework daily.   Time 4   Period Weeks   Status On-going   SLP LONG TERM GOAL #4   Title The patient will participate in conversation, maintaining average loudness of 75 dB and loud, good quality voice.   Time 4   Period Weeks   Status Partially Met          Plan - 11/16/15  Curtis The patient is completing daily tasks and hierarchal speech drill tasks with loud, good quality voice given fewer SLP cues for vocal quality and loudness.     Speech Therapy Frequency 4x / week   Duration 4 weeks   Treatment/Interventions Other (comment)  LSVT-LOUD   Potential to Achieve Goals Good   Potential Considerations Ability to learn/carryover information;Co-morbidities;Cooperation/participation level;Medical prognosis;Previous level of function;Severity of impairments;Family/community support   SLP Home Exercise Plan LSVT-LOUD daily homework   Consulted and Agree with Plan of Care Patient      Patient will benefit from skilled therapeutic intervention in order to improve the following deficits and impairments:    Dysphonia      G-Codes - Dec 04, 2015 1339    Functional Assessment Tool Used LSVT-LOUD protocol, clinical jegment   Functional Limitations Voice   Voice Current Status (G9171) At least 20 percent but less than 40 percent impaired, limited or restricted   Voice Goal Status (G9172) At least 1 percent but less than 20 percent impaired, limited or restricted      Problem List There are no active problems to display for this patient.  Leroy Sea, MS/CCC- SLP  Lou Miner 11/16/2015, 5:26 PM  [place] MAIN [facility] SERVICES [address] [place], [place], [address] Phone: [phone]   Fax:  [phone]   Name: DAQUON GREENLEAF MRN: [id number] Date of Birth: 30-Jan-1941

## 2015-11-17 ENCOUNTER — Ambulatory Visit: Payer: Commercial Managed Care - HMO | Admitting: Speech Pathology

## 2015-11-17 ENCOUNTER — Encounter: Payer: Self-pay | Admitting: Physical Therapy

## 2015-11-17 ENCOUNTER — Ambulatory Visit: Payer: Commercial Managed Care - HMO | Admitting: Physical Therapy

## 2015-11-17 ENCOUNTER — Encounter: Payer: Self-pay | Admitting: Speech Pathology

## 2015-11-17 DIAGNOSIS — R2681 Unsteadiness on feet: Secondary | ICD-10-CM

## 2015-11-17 DIAGNOSIS — R490 Dysphonia: Secondary | ICD-10-CM

## 2015-11-17 DIAGNOSIS — R262 Difficulty in walking, not elsewhere classified: Secondary | ICD-10-CM

## 2015-11-17 NOTE — Therapy (Signed)
[place] MAIN [facility] SERVICES [address] [place], [place], [address] Phone: [phone]   Fax:  [phone]  Physical Therapy Treatment  Patient Details  Name: Barry Joyce MRN: [id number] Date of Birth: [birth year] Referring Provider: Manuella Ghazi  Encounter Date: 11/17/2015      PT End of Session - 11/17/15 1045    Visit Number 14   Number of Visits 17   PT Start Time 1105   PT Stop Time 1200   PT Time Calculation (min) 55 min   Equipment Utilized During Treatment Gait belt   Activity Tolerance Patient tolerated treatment well      Past Medical History  Diagnosis Date  . GERD (gastroesophageal reflux disease)   . Parkinson disease ([place])   . Sarcoidosis ([place])     History reviewed. No pertinent past surgical history.  There were no vitals filed for this visit.      Subjective Assessment - 11/17/15 1109    Subjective Patient is ready to begin LSVT BIG. He is doing his exercises.    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 No/denies      Patient seen for LSVT Daily Session Maximal Daily Exercises for facilitation/coordination of movement Sustained movements are designed to rescale the amplitude of movement output for generalization to daily functional activities .Performed as follows for 1 set of 10 repetitions each multidirectional sustained movements 1) Floor to ceiling 2) Side to side multidirectional Repetitive movements performed in standing and are designed to provide retraining effort needed for sustained muscle activation in tasks   3) Step and reach forward 4) Step and reach backwards 5) Step and reach sideways 6) Rock and reach forward/backward 7) Rock and reach sideways Sit to stand functional component task with supervision 5 reps and simulated activities for:  Dynamic standing balance and ascending and descending steps safely.  Patient continues to demonstrates  less incoordination of movement with select exercises such as rock and reach and stepping backwards. Patient responds well to verbal and tactile cues to correct form and technique. Patient is able to catch mistakes in technique with incorrect positions and is able remember the start and finish positions. Motor control of LE much improved.  Muscle fatigue but no major pain complaints.                           PT Education - 11/17/15 1044    Education provided Yes   Education Details LSVT BIG   Person(s) Educated Patient   Methods Explanation   Comprehension Verbalized understanding             PT Long Term Goals - 11/11/15 1051    PT LONG TERM GOAL #1   Title Patient will be independent in home exercise program to improve strength/mobility for better functional independence with ADLs   Time 4   Period Weeks   Status Partially Met   PT LONG TERM GOAL #2   Title Patient will tolerate 5 seconds of single leg stance without loss of balance to improve ability to get in and out of shower safely   Time 4   Period Weeks   Status Partially Met   PT LONG TERM GOAL #3   Title Patient will ascend/descend 4 stairs without rail assist independently without loss of balance to improve ability to get in/out of home   Time 4   Period  Weeks   Status Partially Met               Plan - 11/17/15 1110    Clinical Impression Statement Min cueing needed to appropriately perform LSVT tasks with leg, hand, and head position   Rehab Potential Good   PT Frequency 4x / week   PT Duration 4 weeks   PT Treatment/Interventions Balance training;Therapeutic exercise;Therapeutic activities   PT Next Visit Plan LSVT BIG   Consulted and Agree with Plan of Care Patient      Patient will benefit from skilled therapeutic intervention in order to improve the following deficits and impairments:  Decreased balance, Difficulty walking, Decreased coordination  Visit  Diagnosis: Difficulty in walking, not elsewhere classified  Unsteadiness on feet     Problem List There are no active problems to display for this patient.  Alanson Puls, PT, DPT [place], [place] S 11/17/2015, 11:10 AM  [place] MAIN [facility] SERVICES [address] [place], [place], [address] Phone: [phone]   Fax:  [phone]  Name: Barry Joyce MRN: [id number] Date of Birth: September 07, 1940

## 2015-11-17 NOTE — Therapy (Signed)
[place] MAIN [facility] SERVICES [address] [place], [place], [address] Phone: [phone]   Fax:  [phone]  Speech Language Pathology Treatment  Patient Details  Name: Barry Joyce MRN: [id number] Date of Birth: Nov 19, 1940 Referring Provider: Dr.Shah  Encounter Date: 11/17/2015      End of Session - 11/17/15 1058    Visit Number 12   Number of Visits 17   Date for SLP Re-Evaluation 12/01/15   SLP Start Time 1000   SLP Stop Time  1058   SLP Time Calculation (min) 58 min   Activity Tolerance Patient tolerated treatment well      Past Medical History  Diagnosis Date  . GERD (gastroesophageal reflux disease)   . Parkinson disease ([place])   . Sarcoidosis ([place])     History reviewed. No pertinent past surgical history.  There were no vitals filed for this visit.      Subjective Assessment - 11/17/15 1058    Subjective The patient feels he had a break through last night RE: his voice   Currently in Pain? No/denies               ADULT SLP TREATMENT - 11/17/15 0001    General Information   Behavior/Cognition Alert;Cooperative;Pleasant mood   Treatment Provided   Treatment provided Cognitive-Linquistic   Pain Assessment   Pain Assessment No/denies pain   Cognitive-Linquistic Treatment   Treatment focused on Voice   Skilled Treatment Daily Task #1 (Maximum sustained "ah"): Average 19 seconds, 83 dB. Daily Task 2 (Maximum fundamental frequency range): Highs: 15 high pitched "ah" given min cues. Lows: 15 low pitched "ah" given min cues. Daily task #3 (Maximum speech loudness drill of functional phrases): Average 75 dB.  Hierarchal speech loudness drill: Generate phrases/sentences given moderate linguistic task, 75 dB.  Homework: assignments completed.  Off the cuff remarks: average 72 dB, with overall improved vocal quality.   Assessment / Recommendations / Plan   Plan Continue with current plan of care   Progression  Toward Goals   Progression toward goals Progressing toward goals          SLP Education - 11/17/15 1058    Education provided Yes   Education Details LSVT-LOUD   Person(s) Educated Patient   Methods Explanation   Comprehension Verbalized understanding            SLP Long Term Goals - 11/15/15 1338    SLP LONG TERM GOAL #1   Title The patient will complete Daily Tasks (Maximum duration "ah", High/Lows, and Functional Phrases) at average loudness of 80 dB and with loud, good quality voice.    Time 4   Period Weeks   Status Partially Met   SLP LONG TERM GOAL #2   Title The patient will complete Hierarchal Speech Loudness reading drills (words/phrases, sentences, and paragraph) at average 75 dB and with loud, good quality voice.     Time 4   Period Weeks   Status Partially Met   SLP LONG TERM GOAL #3   Title The patient will complete homework daily.   Time 4   Period Weeks   Status On-going   SLP LONG TERM GOAL #4   Title The patient will participate in conversation, maintaining average loudness of 75 dB and loud, good quality voice.   Time 4   Period Weeks   Status Partially Met          Plan - 11/17/15 1058    Clinical Impression  Statement The patient is completing daily tasks and hierarchal speech drill tasks with loud, good quality voice given fewer SLP cues for vocal quality and loudness.     Speech Therapy Frequency 4x / week   Duration 4 weeks   Treatment/Interventions Other (comment)  LSVT-LOUD   Potential to Achieve Goals Good   Potential Considerations Ability to learn/carryover information;Co-morbidities;Cooperation/participation level;Medical prognosis;Previous level of function;Severity of impairments;Family/community support   SLP Home Exercise Plan LSVT-LOUD daily homework   Consulted and Agree with Plan of Care Patient      Patient will benefit from skilled therapeutic intervention in order to improve the following deficits and impairments:    Dysphonia    Problem List There are no active problems to display for this patient.  Leroy Sea, MS/CCC- SLP  Lou Miner 11/17/2015, 10:59 AM  [place] MAIN [facility] SERVICES [address] [place], [place], [address] Phone: [phone]   Fax:  [phone]   Name: WILLMER FELLERS MRN: [id number] Date of Birth: 08-Jun-1941

## 2015-11-18 ENCOUNTER — Ambulatory Visit: Payer: Commercial Managed Care - HMO | Admitting: Physical Therapy

## 2015-11-18 ENCOUNTER — Ambulatory Visit: Payer: Commercial Managed Care - HMO | Admitting: Speech Pathology

## 2015-11-18 ENCOUNTER — Encounter: Payer: Self-pay | Admitting: Speech Pathology

## 2015-11-18 ENCOUNTER — Encounter: Payer: Self-pay | Admitting: Physical Therapy

## 2015-11-18 DIAGNOSIS — R2681 Unsteadiness on feet: Secondary | ICD-10-CM

## 2015-11-18 DIAGNOSIS — R490 Dysphonia: Secondary | ICD-10-CM | POA: Diagnosis not present

## 2015-11-18 DIAGNOSIS — R262 Difficulty in walking, not elsewhere classified: Secondary | ICD-10-CM

## 2015-11-18 NOTE — Therapy (Signed)
[place] MAIN [facility] SERVICES [address] [place], [place], [address] Phone: [phone]   Fax:  [phone]  Physical Therapy Treatment  Patient Details  Name: Barry Joyce MRN: [id number] Date of Birth: 1941/06/24 Referring Provider: Manuella Ghazi  Encounter Date: 11/18/2015      PT End of Session - 11/18/15 1016    Visit Number 15   Number of Visits 17   PT Start Time 1100   PT Stop Time 1200   PT Time Calculation (min) 60 min   Equipment Utilized During Treatment Gait belt   Activity Tolerance Patient tolerated treatment well      Past Medical History  Diagnosis Date  . GERD (gastroesophageal reflux disease)   . Parkinson disease ([place])   . Sarcoidosis ([place])     History reviewed. No pertinent past surgical history.  There were no vitals filed for this visit.      Subjective Assessment - 11/18/15 1105    Subjective Patient is ready to begin LSVT BIG. He is doing his exercises.    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 No/denies        Min cueing needed to appropriately perform LSVT tasks with leg, hand, and head position. Decreased coordination demonstrated requiring consistent verbal cueing to correct form. Cognitive understanding of task was delayed. Patient continues to demonstrate some in coordination of movement with select exercises such as rock and reach and stepping backwards. Patient responds well to verbal and tactile cues to correct form and technique.  CGA to SBA for safety with activities.  Uses to increase intensity and amplitude of movements throughout session  Patient seen for LSVT Daily Session Maximal Daily Exercises for facilitation/coordination of movement Sustained movements are designed to rescale the amplitude of movement output for generalization to daily functional activities .Performed as follows for 1 set of 10 repetitions each  multidirectional sustained movements 1) Floor to ceiling 2) Side to side multidirectional Repetitive movements performed in standing and are designed to provide retraining effort needed for sustained muscle activation in tasks   3) Step and reach forward 4) Step and reach backwards 5) Step and reach sideways 6) Rock and reach forward/backward 7) Rock and reach sideways Sit to stand functional component task with supervision 5 reps and  Simulated activities for ascending and descending steps, raising his feet and tapping and reaching for dynamic standing balance.                           PT Education - 11/18/15 1015    Education provided Yes   Education Details LSVT BIG   Person(s) Educated Patient   Methods Explanation   Comprehension Verbalized understanding             PT Long Term Goals - 11/11/15 1051    PT LONG TERM GOAL #1   Title Patient will be independent in home exercise program to improve strength/mobility for better functional independence with ADLs   Time 4   Period Weeks   Status Partially Met   PT LONG TERM GOAL #2   Title Patient will tolerate 5 seconds of single leg stance without loss of balance to improve ability to get in and out of shower safely   Time 4   Period Weeks   Status Partially Met   PT LONG TERM GOAL #3   Title Patient will ascend/descend 4  stairs without rail assist independently without loss of balance to improve ability to get in/out of home   Time 4   Period Weeks   Status Partially Met               Plan - 11/18/15 1105    Clinical Impression Statement Patient has difficulty with finishing BIG and needs extra cuing to perform exercises with correct amplitude and speed.   Rehab Potential Good   PT Frequency 4x / week   PT Duration 4 weeks   PT Treatment/Interventions Balance training;Therapeutic exercise;Therapeutic activities   PT Next Visit Plan LSVT BIG   Consulted and Agree with Plan of Care Patient       Patient will benefit from skilled therapeutic intervention in order to improve the following deficits and impairments:  Decreased balance, Difficulty walking, Decreased coordination  Visit Diagnosis: Unsteadiness on feet  Difficulty in walking, not elsewhere classified     Problem List There are no active problems to display for this patient.  Alanson Puls, PT, DPT [place], [place] S 11/18/2015, 11:06 AM  [place] MAIN [facility] SERVICES [address] [place], [place], [address] Phone: [phone]   Fax:  [phone]  Name: Barry Joyce MRN: [id number] Date of Birth: [birth year]

## 2015-11-18 NOTE — Therapy (Signed)
[place] MAIN [facility] SERVICES [address] [place], [place], [address] Phone: [phone]   Fax:  [phone]  Speech Language Pathology Treatment  Patient Details  Name: Barry Joyce MRN: [id number] Date of Birth: 05/16/1941 Referring Provider: Dr.Shah  Encounter Date: 11/18/2015      End of Session - 11/18/15 1359    Visit Number 13   Number of Visits 17   Date for SLP Re-Evaluation 12/01/15   SLP Start Time 1009   SLP Stop Time  1100   SLP Time Calculation (min) 51 min   Activity Tolerance Patient tolerated treatment well      Past Medical History  Diagnosis Date  . GERD (gastroesophageal reflux disease)   . Parkinson disease ([place])   . Sarcoidosis ([place])     History reviewed. No pertinent past surgical history.  There were no vitals filed for this visit.      Subjective Assessment - 11/18/15 1359    Subjective The patient feels he had a break through RE: his voice               ADULT SLP TREATMENT - 11/18/15 0001    General Information   Behavior/Cognition Alert;Cooperative;Pleasant mood   Treatment Provided   Treatment provided Cognitive-Linquistic   Pain Assessment   Pain Assessment No/denies pain   Cognitive-Linquistic Treatment   Treatment focused on Voice   Skilled Treatment Daily Task #1 (Maximum sustained "ah"): Average 23 seconds, 85 dB. Daily Task 2 (Maximum fundamental frequency range): Highs: 15 high pitched "ah" given min cues. Lows: 15 low pitched "ah" given min cues. Daily task #3 (Maximum speech loudness drill of functional phrases): Average 75 dB.  Hierarchal speech loudness drill: Generate phrases/sentences given moderate linguistic task, 75 dB.  Homework: assignments completed.  Off the cuff remarks: average 72 dB, with overall improved vocal quality.   Assessment / Recommendations / Plan   Plan Continue with current plan of care   Progression Toward Goals   Progression toward goals  Progressing toward goals          SLP Education - 11/18/15 1359    Education provided Yes   Education Details LSVT-LOUD   Person(s) Educated Patient   Methods Explanation   Comprehension Verbalized understanding            SLP Long Term Goals - 11/15/15 1338    SLP LONG TERM GOAL #1   Title The patient will complete Daily Tasks (Maximum duration "ah", High/Lows, and Functional Phrases) at average loudness of 80 dB and with loud, good quality voice.    Time 4   Period Weeks   Status Partially Met   SLP LONG TERM GOAL #2   Title The patient will complete Hierarchal Speech Loudness reading drills (words/phrases, sentences, and paragraph) at average 75 dB and with loud, good quality voice.     Time 4   Period Weeks   Status Partially Met   SLP LONG TERM GOAL #3   Title The patient will complete homework daily.   Time 4   Period Weeks   Status On-going   SLP LONG TERM GOAL #4   Title The patient will participate in conversation, maintaining average loudness of 75 dB and loud, good quality voice.   Time 4   Period Weeks   Status Partially Met          Plan - 11/18/15 1359    Clinical Impression Statement The patient is completing daily tasks and  hierarchal speech drill tasks with loud, good quality voice given fewer SLP cues for vocal quality and loudness.     Speech Therapy Frequency 4x / week   Duration 4 weeks   Treatment/Interventions Other (comment)  LSVT-LOUD   Potential to Achieve Goals Good   Potential Considerations Ability to learn/carryover information;Co-morbidities;Cooperation/participation level;Medical prognosis;Previous level of function;Severity of impairments;Family/community support   SLP Home Exercise Plan LSVT-LOUD daily homework   Consulted and Agree with Plan of Care Patient      Patient will benefit from skilled therapeutic intervention in order to improve the following deficits and impairments:   Dysphonia    Problem List There are no  active problems to display for this patient.  Leroy Sea, [place], [name] 11/18/2015, 2:00 PM  [place] MAIN [facility] SERVICES [address] [place], [place], [address] Phone: [phone]   Fax:  [phone]   Name: MATHIEU SCHLOEMER MRN: [id number] Date of Birth: 02/18/1941

## 2015-11-19 ENCOUNTER — Ambulatory Visit: Payer: Commercial Managed Care - HMO | Admitting: Speech Pathology

## 2015-11-22 ENCOUNTER — Encounter: Payer: Self-pay | Admitting: Speech Pathology

## 2015-11-22 ENCOUNTER — Encounter: Payer: Self-pay | Admitting: Physical Therapy

## 2015-11-22 ENCOUNTER — Ambulatory Visit: Payer: Commercial Managed Care - HMO | Admitting: Speech Pathology

## 2015-11-22 ENCOUNTER — Ambulatory Visit: Payer: Commercial Managed Care - HMO | Admitting: Physical Therapy

## 2015-11-22 DIAGNOSIS — R2681 Unsteadiness on feet: Secondary | ICD-10-CM | POA: Diagnosis not present

## 2015-11-22 DIAGNOSIS — R262 Difficulty in walking, not elsewhere classified: Secondary | ICD-10-CM

## 2015-11-22 DIAGNOSIS — R490 Dysphonia: Secondary | ICD-10-CM | POA: Diagnosis not present

## 2015-11-22 NOTE — Therapy (Signed)
[place] [facility] SERVICES [address] [place], [place], [address] Phone: [phone]   Fax:  [phone]  Physical Therapy Treatment  Patient Details  Name: Barry Joyce MRN: [id number] Date of Birth: 10/02/1940 Referring Provider: Manuella Ghazi  Encounter Date: 11/22/2015      PT End of Session - 11/22/15 1112    Visit Number 16   Number of Visits 17   PT Start Time 1100   PT Stop Time 1200   PT Time Calculation (min) 60 min   Equipment Utilized During Treatment Gait belt   Activity Tolerance Patient tolerated treatment well      Past Medical History  Diagnosis Date  . GERD (gastroesophageal reflux disease)   . Parkinson disease ([place])   . Sarcoidosis ([place])     History reviewed. No pertinent past surgical history.  There were no vitals filed for this visit.      Subjective Assessment - 11/22/15 1111    Subjective Patient is ready to begin LSVT BIG. He is doing his exercises.    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 No/denies      Patient seen for LSVT Daily Session Maximal Daily Exercises for facilitation/coordination of movement Sustained movements are designed to rescale the amplitude of movement output for generalization to daily functional activities .Performed as follows for 1 set of 10 repetitions each multidirectional sustained movements 1) Floor to ceiling 2) Side to side multidirectional Repetitive movements performed in standing and are designed to provide retraining effort needed for sustained muscle activation in tasks   3) Step and reach forward 4) Step and reach backwards 5) Step and reach sideways 6) Rock and reach forward/backward 7) Rock and reach sideways Sit to stand functional component task with supervision 5 reps and simulated activities for standing balance performance with reaching and tapping feet for single leg balance deficits. Patient  ascending/descending steps x 5.  Functional mobility: ambulated >1000' with fair cadence and reciprocating arm swing holding 1 pound hand weights while doing dual naming tasks  Patient needs cueing for correct BIG arm swing. He improves with practice.Patient needs cuing to consistently swing her arms and also swing reciprocally.                            PT Education - 11/22/15 1112    Education provided Yes   Education Details LSVT BIG   Person(s) Educated Patient   Comprehension Verbalized understanding             PT Long Term Goals - 11/11/15 1051    PT LONG TERM GOAL #1   Title Patient will be independent in home exercise program to improve strength/mobility for better functional independence with ADLs   Time 4   Period Weeks   Status Partially Met   PT LONG TERM GOAL #2   Title Patient will tolerate 5 seconds of single leg stance without loss of balance to improve ability to get in and out of shower safely   Time 4   Period Weeks   Status Partially Met   PT LONG TERM GOAL #3   Title Patient will ascend/descend 4 stairs without rail assist independently without loss of balance to improve ability to get in/out of home   Time 4   Period Weeks   Status Partially Met  Plan - 11/22/15 1112    Clinical Impression Statement Patient continues to demonstrates less incoordination of movement with select exercises such as rock and reach and stepping backwards. Patient responds well to verbal and tactile cues to correct form and technique   Rehab Potential Good   PT Frequency 4x / week   PT Duration 4 weeks   PT Treatment/Interventions Balance training;Therapeutic exercise;Therapeutic activities   PT Next Visit Plan LSVT BIG   Consulted and Agree with Plan of Care Patient      Patient will benefit from skilled therapeutic intervention in order to improve the following deficits and impairments:  Decreased balance, Difficulty  walking, Decreased coordination  Visit Diagnosis: Unsteadiness on feet  Difficulty in walking, not elsewhere classified     Problem List There are no active problems to display for this patient. Alanson Puls, PT, DPT  [place], Minette Headland S 11/22/2015, 11:14 AM  [place] [facility] SERVICES [address] [place], [place], [address] Phone: [phone]   Fax:  [phone]  Name: Barry Joyce MRN: [id number] Date of Birth: March 14, 1941

## 2015-11-22 NOTE — Therapy (Signed)
[place] [facility] SERVICES [address] [place], [place], [address] Phone: [phone]   Fax:  [phone]  Speech Language Pathology Treatment  Patient Details  Name: Barry Joyce MRN: [id number] Date of Birth: 20-Nov-1940 Referring Provider: Dr.Shah  Encounter Date: 11/22/2015      End of Session - 11/22/15 1157    Visit Number 14   Number of Visits 17   Date for SLP Re-Evaluation 12/01/15   SLP Start Time 1000   SLP Stop Time  1100   SLP Time Calculation (min) 60 min   Activity Tolerance Patient tolerated treatment well      Past Medical History  Diagnosis Date  . GERD (gastroesophageal reflux disease)   . Parkinson disease ([place])   . Sarcoidosis ([place])     History reviewed. No pertinent past surgical history.  There were no vitals filed for this visit.      Subjective Assessment - 11/22/15 1157    Subjective The patient feels he had a break through RE: his voice   Currently in Pain? No/denies               ADULT SLP TREATMENT - 11/22/15 0001    General Information   Behavior/Cognition Alert;Cooperative;Pleasant mood   Treatment Provided   Treatment provided Cognitive-Linquistic   Pain Assessment   Pain Assessment No/denies pain   Cognitive-Linquistic Treatment   Treatment focused on Voice   Skilled Treatment Daily Task #1 (Maximum sustained "ah"): Average 23 seconds, 85 dB. Daily Task 2 (Maximum fundamental frequency range): Highs: 15 high pitched "ah" given min cues. Lows: 15 low pitched "ah" given min cues. Daily task #3 (Maximum speech loudness drill of functional phrases): Average 75 dB.  Hierarchal speech loudness drill: Generate phrases/sentences given moderate linguistic task, 75 dB.  Homework: assignments completed.  Off the cuff remarks: average 72 dB, with overall improved vocal quality.   Assessment / Recommendations / Plan   Plan Continue with current plan of care   Progression Toward Goals   Progression toward goals Progressing toward goals          SLP Education - 11/22/15 1157    Education provided Yes   Education Details LSVT-LOUD   Person(s) Educated Patient   Methods Explanation   Comprehension Verbalized understanding            SLP Long Term Goals - 11/15/15 1338    SLP LONG TERM GOAL #1   Title The patient will complete Daily Tasks (Maximum duration "ah", High/Lows, and Functional Phrases) at average loudness of 80 dB and with loud, good quality voice.    Time 4   Period Weeks   Status Partially Met   SLP LONG TERM GOAL #2   Title The patient will complete Hierarchal Speech Loudness reading drills (words/phrases, sentences, and paragraph) at average 75 dB and with loud, good quality voice.     Time 4   Period Weeks   Status Partially Met   SLP LONG TERM GOAL #3   Title The patient will complete homework daily.   Time 4   Period Weeks   Status On-going   SLP LONG TERM GOAL #4   Title The patient will participate in conversation, maintaining average loudness of 75 dB and loud, good quality voice.   Time 4   Period Weeks   Status Partially Met        Patient will benefit from skilled therapeutic intervention in order to improve the following deficits  and impairments:   Dysphonia    Problem List There are no active problems to display for this patient.  Leroy Sea, MS/CCC- SLP  Lou Miner 11/22/2015, 11:58 AM  [place] MA[facility] SERVICES [address] [place], [place], [address] Phone: [phone]   Fax:  [phone]   Name: Barry Joyce MRN: [id number] Date of Birth: [birth year]

## 2015-11-23 ENCOUNTER — Ambulatory Visit: Payer: Commercial Managed Care - HMO | Admitting: Speech Pathology

## 2015-11-23 ENCOUNTER — Encounter: Payer: Self-pay | Admitting: Physical Therapy

## 2015-11-23 ENCOUNTER — Ambulatory Visit: Payer: Commercial Managed Care - HMO | Admitting: Physical Therapy

## 2015-11-23 ENCOUNTER — Encounter: Payer: Self-pay | Admitting: Speech Pathology

## 2015-11-23 DIAGNOSIS — R490 Dysphonia: Secondary | ICD-10-CM

## 2015-11-23 DIAGNOSIS — R262 Difficulty in walking, not elsewhere classified: Secondary | ICD-10-CM | POA: Diagnosis not present

## 2015-11-23 DIAGNOSIS — R2681 Unsteadiness on feet: Secondary | ICD-10-CM | POA: Diagnosis not present

## 2015-11-23 NOTE — Therapy (Signed)
[place] [facility] SERVICES [address] [place], [place], [address] Phone: [phone]   Fax:  [phone]  Physical Therapy Treatment  Patient Details  Name: Barry Joyce MRN: [id number] Date of Birth: 07-Sep-1940 Referring Provider: Manuella Ghazi  Encounter Date: 11/23/2015      PT End of Session - 11/23/15 1020    Visit Number 17   Number of Visits 17   PT Start Time 1100   PT Stop Time 1155   PT Time Calculation (min) 55 min   Equipment Utilized During Treatment Gait belt   Activity Tolerance Patient tolerated treatment well      Past Medical History  Diagnosis Date  . GERD (gastroesophageal reflux disease)   . Parkinson disease ([place])   . Sarcoidosis ([place])     History reviewed. No pertinent past surgical history.  There were no vitals filed for this visit.      Subjective Assessment - 11/23/15 1106    Subjective Patient is ready to begin LSVT BIG. He is doing his exercises.    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 No/denies      CGA to SBA for safety with activities.  Uses to increase intensity and amplitude of movements throughout session.pt especially has difficulty with opening his hands up towards the ceiling with multiple exercises and with extending his leg during seated side to side multi-directional reaching activity  Patient seen for LSVT Daily Session Maximal Daily Exercises for facilitation/coordination of movement Sustained movements are designed to rescale the amplitude of movement output for generalization to daily functional activities .Performed as follows for 1 set of 10 repetitions each multidirectional sustained movements 1) Floor to ceiling 2) Side to side multidirectional Repetitive movements performed in standing and are designed to provide retraining effort needed for sustained muscle activation in tasks   3) Step and reach forward 4) Step  and reach backwards 5) Step and reach sideways 6) Rock and reach forward/backward 7) Rock and reach sideways Sit to stand functional component task with supervision 5 reps and  Simulated activities for: improving dynamic standing balance such as reaching down and high with cones and tapping steps  With blue foam and ascending and descending steps.  Functional mobility: ambulated >1000' with fair cadence and reciprocating arm swing holding 1 pound hand weights while doing dual naming tasks                            PT Education - 11/23/15 1019    Education provided Yes   Education Details LSVT BIG   Person(s) Educated Patient   Methods Explanation   Comprehension Verbalized understanding             PT Long Term Goals - 11/11/15 1051    PT LONG TERM GOAL #1   Title Patient will be independent in home exercise program to improve strength/mobility for better functional independence with ADLs   Time 4   Period Weeks   Status Partially Met   PT LONG TERM GOAL #2   Title Patient will tolerate 5 seconds of single leg stance without loss of balance to improve ability to get in and out of shower safely   Time 4   Period Weeks   Status Partially Met   PT LONG TERM GOAL #3   Title Patient will ascend/descend 4 stairs without rail assist independently without loss  of balance to improve ability to get in/out of home   Time 4   Period Weeks   Status Partially Met               Plan - 11/23/15 1106    Clinical Impression Statement CGA to SBA for safety with activities.  Uses to increase intensity and amplitude of movements throughout session.pt especially has difficulty with opening his hands up towards the ceiling with multiple exercises and with extending his leg during seated side to side multi-directional reaching activity   Rehab Potential Good   PT Frequency 4x / week   PT Duration 4 weeks   PT Treatment/Interventions Balance training;Therapeutic  exercise;Therapeutic activities   PT Next Visit Plan LSVT BIG   Consulted and Agree with Plan of Care Patient      Patient will benefit from skilled therapeutic intervention in order to improve the following deficits and impairments:  Decreased balance, Difficulty walking, Decreased coordination  Visit Diagnosis: Unsteadiness on feet  Difficulty in walking, not elsewhere classified     Problem List There are no active problems to display for this patient.  Alanson Puls, PT, DPT [place], Minette Headland S 11/23/2015, 11:07 AM  [place] [facility] SERVICES [address] [place], [place], [address] Phone: [phone]   Fax:  [phone]  Name: Barry Joyce MRN: [id number] Date of Birth: 29-Feb-1941

## 2015-11-23 NOTE — Therapy (Signed)
[place] MAIN [facility] SERVICES [address] [place], [place], [address] Phone: [phone]   Fax:  [phone]  Speech Language Pathology Treatment  Patient Details  Name: Barry Joyce MRN: [id number] Date of Birth: 07/06/1940 Referring Provider: Dr.Shah  Encounter Date: 11/23/2015      End of Session - 11/23/15 1242    Visit Number 15   Number of Visits 17   Date for SLP Re-Evaluation 12/01/15   SLP Start Time 1010   SLP Stop Time  1100   SLP Time Calculation (min) 50 min   Activity Tolerance Patient tolerated treatment well      Past Medical History  Diagnosis Date  . GERD (gastroesophageal reflux disease)   . Parkinson disease ([place])   . Sarcoidosis ([place])     History reviewed. No pertinent past surgical history.  There were no vitals filed for this visit.      Subjective Assessment - 11/23/15 1242    Subjective The patient feels he had a break through RE: his voice   Currently in Pain? No/denies               ADULT SLP TREATMENT - 11/23/15 0001    General Information   Behavior/Cognition Alert;Cooperative;Pleasant mood   Treatment Provided   Treatment provided Cognitive-Linquistic   Pain Assessment   Pain Assessment No/denies pain   Cognitive-Linquistic Treatment   Treatment focused on Voice   Skilled Treatment Daily Task #1 (Maximum sustained "ah"): Average 23 seconds, 85 dB. Daily Task 2 (Maximum fundamental frequency range): Highs: 15 high pitched "ah" given no cues. Lows: 15 low pitched "ah" given no cues. Daily task #3 (Maximum speech loudness drill of functional phrases): Average 75 dB.  Hierarchal speech loudness drill: Generate phrases/sentences given moderate linguistic task, 75 dB.  Homework: assignments completed.  Off the cuff remarks: average 72 dB, with overall improved vocal quality.   Assessment / Recommendations / Plan   Plan Continue with current plan of care   Progression Toward Goals    Progression toward goals Progressing toward goals          SLP Education - 11/23/15 1242    Education provided Yes   Education Details LSVT-LOUD   Person(s) Educated Patient   Methods Explanation   Comprehension Verbalized understanding            SLP Long Term Goals - 11/15/15 1338    SLP LONG TERM GOAL #1   Title The patient will complete Daily Tasks (Maximum duration "ah", High/Lows, and Functional Phrases) at average loudness of 80 dB and with loud, good quality voice.    Time 4   Period Weeks   Status Partially Met   SLP LONG TERM GOAL #2   Title The patient will complete Hierarchal Speech Loudness reading drills (words/phrases, sentences, and paragraph) at average 75 dB and with loud, good quality voice.     Time 4   Period Weeks   Status Partially Met   SLP LONG TERM GOAL #3   Title The patient will complete homework daily.   Time 4   Period Weeks   Status On-going   SLP LONG TERM GOAL #4   Title The patient will participate in conversation, maintaining average loudness of 75 dB and loud, good quality voice.   Time 4   Period Weeks   Status Partially Met          Plan - 11/23/15 1242    Clinical Impression Statement The  patient is completing daily tasks and hierarchal speech drill tasks with loud, good quality voice given fewer SLP cues for vocal quality and loudness.     Speech Therapy Frequency 4x / week   Duration 4 weeks   Treatment/Interventions Other (comment)  LSVT-LOUD   Potential to Achieve Goals Good   Potential Considerations Ability to learn/carryover information;Co-morbidities;Cooperation/participation level;Medical prognosis;Previous level of function;Severity of impairments;Family/community support   SLP Home Exercise Plan LSVT-LOUD daily homework   Consulted and Agree with Plan of Care Patient      Patient will benefit from skilled therapeutic intervention in order to improve the following deficits and impairments:    Dysphonia    Problem List There are no active problems to display for this patient.  Leroy Sea, MS/CCC- SLP  Lou Miner 11/23/2015, 12:43 PM  [place] MAIN [facility] SERVICES [address] [place], [place], [address] Phone: [phone]   Fax:  [phone]   Name: Barry Joyce MRN: [id number] Date of Birth: [birth year]

## 2015-11-24 ENCOUNTER — Ambulatory Visit: Payer: Commercial Managed Care - HMO | Admitting: Speech Pathology

## 2015-11-24 ENCOUNTER — Encounter: Payer: Self-pay | Admitting: Speech Pathology

## 2015-11-24 ENCOUNTER — Encounter: Payer: Self-pay | Admitting: Physical Therapy

## 2015-11-24 ENCOUNTER — Ambulatory Visit: Payer: Commercial Managed Care - HMO | Admitting: Physical Therapy

## 2015-11-24 DIAGNOSIS — R490 Dysphonia: Secondary | ICD-10-CM | POA: Diagnosis not present

## 2015-11-24 DIAGNOSIS — R2681 Unsteadiness on feet: Secondary | ICD-10-CM | POA: Diagnosis not present

## 2015-11-24 DIAGNOSIS — R262 Difficulty in walking, not elsewhere classified: Secondary | ICD-10-CM | POA: Diagnosis not present

## 2015-11-24 NOTE — Therapy (Signed)
[place] MAIN [facility] SERVICES [address] [place], [place], [address] Phone: [phone]   Fax:  [phone]  Speech Language Pathology Treatment  Patient Details  Name: Barry Joyce MRN: [id number] Date of Birth: January 18, 1941 Referring Provider: Dr.Shah  Encounter Date: 11/24/2015      End of Session - 11/24/15 1218    Visit Number 16   Number of Visits 17   Date for SLP Re-Evaluation 12/01/15   SLP Start Time 1010   SLP Stop Time  1100   SLP Time Calculation (min) 50 min   Activity Tolerance Patient tolerated treatment well      Past Medical History  Diagnosis Date  . GERD (gastroesophageal reflux disease)   . Parkinson disease ([place])   . Sarcoidosis ([place])     History reviewed. No pertinent past surgical history.  There were no vitals filed for this visit.      Subjective Assessment - 11/24/15 1218    Subjective The patient feels he had a break through RE: his voice   Currently in Pain? No/denies               ADULT SLP TREATMENT - 11/24/15 0001    General Information   Behavior/Cognition Alert;Cooperative;Pleasant mood   Treatment Provided   Treatment provided Cognitive-Linquistic   Pain Assessment   Pain Assessment No/denies pain   Cognitive-Linquistic Treatment   Treatment focused on Voice   Skilled Treatment Daily Task #1 (Maximum sustained "ah"): Average 23 seconds, 85 dB. Daily Task 2 (Maximum fundamental frequency range): Highs: 15 high pitched "ah" given no cues. Lows: 15 low pitched "ah" given no cues. Daily task #3 (Maximum speech loudness drill of functional phrases): Average 75 dB.  Hierarchal speech loudness drill: Generate phrases/sentences given moderate linguistic task, 75 dB.  Homework: assignments completed.  Off the cuff remarks: average 72 dB, with overall improved vocal quality.   Assessment / Recommendations / Plan   Plan Continue with current plan of care   Progression Toward Goals    Progression toward goals Progressing toward goals          SLP Education - 11/24/15 1218    Education provided Yes   Education Details LSVT-LOUD   Person(s) Educated Patient   Methods Explanation   Comprehension Verbalized understanding            SLP Long Term Goals - 11/15/15 1338    SLP LONG TERM GOAL #1   Title The patient will complete Daily Tasks (Maximum duration "ah", High/Lows, and Functional Phrases) at average loudness of 80 dB and with loud, good quality voice.    Time 4   Period Weeks   Status Partially Met   SLP LONG TERM GOAL #2   Title The patient will complete Hierarchal Speech Loudness reading drills (words/phrases, sentences, and paragraph) at average 75 dB and with loud, good quality voice.     Time 4   Period Weeks   Status Partially Met   SLP LONG TERM GOAL #3   Title The patient will complete homework daily.   Time 4   Period Weeks   Status On-going   SLP LONG TERM GOAL #4   Title The patient will participate in conversation, maintaining average loudness of 75 dB and loud, good quality voice.   Time 4   Period Weeks   Status Partially Met          Plan - 11/24/15 1218    Clinical Impression Statement The  patient is completing daily tasks and hierarchal speech drill tasks with loud, good quality voice given fewer SLP cues for vocal quality and loudness.     Speech Therapy Frequency 4x / week   Duration 4 weeks   Treatment/Interventions Other (comment)  LSVT-LOUD   Potential to Achieve Goals Good   Potential Considerations Ability to learn/carryover information;Co-morbidities;Cooperation/participation level;Medical prognosis;Previous level of function;Severity of impairments;Family/community support   SLP Home Exercise Plan LSVT-LOUD daily homework   Consulted and Agree with Plan of Care Patient      Patient will benefit from skilled therapeutic intervention in order to improve the following deficits and impairments:    Dysphonia    Problem List There are no active problems to display for this patient.  Leroy Sea, MS/CCC- SLP  Lou Miner 11/24/2015, 12:19 PM  [place] MAIN [facility] SERVICES [address] [place], [place], [address] Phone: [phone]   Fax:  [phone]   Name: Barry Joyce MRN: [id number] Date of Birth: [birth year]

## 2015-11-24 NOTE — Therapy (Signed)
[place] MAIN [facility] SERVICES [address] [place], [place], [address] Phone: [phone]   Fax:  [phone]  Physical Therapy Treatment  Patient Details  Name: Barry Joyce MRN: [id number] Date of Birth: [birth year] Referring Provider: Manuella Ghazi  Encounter Date: 11/24/2015      PT End of Session - 11/24/15 1015    Visit Number 18   Number of Visits 17   PT Start Time 1100   PT Stop Time 1155   PT Time Calculation (min) 55 min   Equipment Utilized During Treatment Gait belt   Activity Tolerance Patient tolerated treatment well      Past Medical History  Diagnosis Date  . GERD (gastroesophageal reflux disease)   . Parkinson disease ([place])   . Sarcoidosis ([place])     History reviewed. No pertinent past surgical history.  There were no vitals filed for this visit.      Subjective Assessment - 11/24/15 1111    Subjective Patient is ready to begin LSVT BIG. He is doing his exercises.    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 No/denies      Patient seen for LSVT Daily Session Maximal Daily Exercises for facilitation/coordination of movement Sustained movements are designed to rescale the amplitude of movement output for generalization to daily functional activities .Performed as follows for 1 set of 10 repetitions each multidirectional sustained movements 1) Floor to ceiling 2) Side to side multidirectional Repetitive movements performed in standing and are designed to provide retraining effort needed for sustained muscle activation in tasks   3) Step and reach forward 4) Step and reach backwards 5) Step and reach sideways 6) Rock and reach forward/backward 7) Rock and reach sideways Sit to stand functional component task with supervision 5 reps and  Simulated activities for tapping feet, reaching for objects low and high,  and ascending and descending steps for improved  standing dynamic balance.   Functional mobility: ambulated >1000' with fair cadence and reciprocating arm swing holding 1 pound hand weights while doing dual naming tasks.                             PT Education - 11/24/15 1014    Education provided Yes   Education Details LSVT BIG   Person(s) Educated Patient   Methods Explanation   Comprehension Verbalized understanding             PT Long Term Goals - 11/11/15 1051    PT LONG TERM GOAL #1   Title Patient will be independent in home exercise program to improve strength/mobility for better functional independence with ADLs   Time 4   Period Weeks   Status Partially Met   PT LONG TERM GOAL #2   Title Patient will tolerate 5 seconds of single leg stance without loss of balance to improve ability to get in and out of shower safely   Time 4   Period Weeks   Status Partially Met   PT LONG TERM GOAL #3   Title Patient will ascend/descend 4 stairs without rail assist independently without loss of balance to improve ability to get in/out of home   Time 4   Period Weeks   Status Partially Met               Plan - 11/24/15 1111    Clinical Impression Statement Patient continues  to demonstrates less incoordination of movement with select exercises such as rock and reach and stepping backwards. Patient responds well to verbal and tactile cues to correct form and technique. Patient is able to catch mistakes in technique with incorrect positions and is able remember the start and finish positions. Motor control of LE much improved.  Muscle fatigue but no major pain complaints   Rehab Potential Good   PT Frequency 4x / week   PT Duration 4 weeks   PT Treatment/Interventions Balance training;Therapeutic exercise;Therapeutic activities   PT Next Visit Plan LSVT BIG   Consulted and Agree with Plan of Care Patient      Patient will benefit from skilled therapeutic intervention in order to improve the  following deficits and impairments:  Decreased balance, Difficulty walking, Decreased coordination  Visit Diagnosis: Unsteadiness on feet  Difficulty in walking, not elsewhere classified     Problem List There are no active problems to display for this patient.  Alanson Puls, PT, DPT [place], Minette Headland S 11/24/2015, 11:12 AM  [place] MAIN [facility] SERVICES [address] [place], [place], [address] Phone: [phone]   Fax:  [phone]  Name: Barry Joyce MRN: [id number] Date of Birth: 13-Apr-1941

## 2015-11-25 ENCOUNTER — Encounter: Payer: Self-pay | Admitting: Physical Therapy

## 2015-11-25 ENCOUNTER — Encounter: Payer: Self-pay | Admitting: Speech Pathology

## 2015-11-25 ENCOUNTER — Ambulatory Visit: Payer: Commercial Managed Care - HMO | Admitting: Speech Pathology

## 2015-11-25 ENCOUNTER — Ambulatory Visit: Payer: Commercial Managed Care - HMO | Admitting: Physical Therapy

## 2015-11-25 DIAGNOSIS — R262 Difficulty in walking, not elsewhere classified: Secondary | ICD-10-CM

## 2015-11-25 DIAGNOSIS — R2681 Unsteadiness on feet: Secondary | ICD-10-CM

## 2015-11-25 DIAGNOSIS — R490 Dysphonia: Secondary | ICD-10-CM | POA: Diagnosis not present

## 2015-11-25 NOTE — Therapy (Signed)
[place] MAIN [facility] SERVICES [address] [place], [place], [address] Phone: [phone]   Fax:  [phone]  Speech Language Pathology Treatment/Discharge Summary  Patient Details  Name: Barry Joyce MRN: [id number] Date of Birth: 10/23/1940 Referring Provider: Dr.Shah  Encounter Date: 11/25/2015      End of Session - 11/25/15 1237    Visit Number 17   Number of Visits 17   Date for SLP Re-Evaluation 12/01/15   SLP Start Time 1010   SLP Stop Time  1100   SLP Time Calculation (min) 50 min   Activity Tolerance Patient tolerated treatment well      Past Medical History  Diagnosis Date  . GERD (gastroesophageal reflux disease)   . Parkinson disease ([place])   . Sarcoidosis ([place])     History reviewed. No pertinent past surgical history.  There were no vitals filed for this visit.      Subjective Assessment - 11/25/15 1237    Subjective The patient states that his voice is stronger   Currently in Pain? No/denies               ADULT SLP TREATMENT - 11/25/15 0001    General Information   Behavior/Cognition Alert;Cooperative;Pleasant mood   Treatment Provided   Treatment provided Cognitive-Linquistic   Pain Assessment   Pain Assessment No/denies pain   Cognitive-Linquistic Treatment   Treatment focused on Voice   Skilled Treatment Daily Task #1 (Maximum sustained "ah"): Average 23 seconds, 85 dB. Daily Task 2 (Maximum fundamental frequency range): Highs: 15 high pitched "ah" given no cues. Lows: 15 low pitched "ah" given no cues. Daily task #3 (Maximum speech loudness drill of functional phrases): Average 75 dB.  Hierarchal speech loudness drill: Read paragraphs, 75 dB Generate phrases/sentences given moderate linguistic task, 75 dB.  Homework: assignments completed.  Off the cuff remarks: average 72 dB, with overall improved vocal quality.   Assessment / Recommendations / Plan   Plan Discharge SLP treatment due to  (comment);All goals met   Progression Toward Goals   Progression toward goals Goals met, education completed, patient discharged from [facility] - 11/25/15 1237    Education provided Yes   Education Details LSVT-LOUD "forever" homework   Person(s) Educated Patient   Methods Explanation   Comprehension Verbalized understanding            SLP Long Term Goals - 11/25/15 1239    SLP LONG TERM GOAL #1   Title The patient will complete Daily Tasks (Maximum duration "ah", High/Lows, and Functional Phrases) at average loudness of 80 dB and with loud, good quality voice.    Status Achieved   SLP LONG TERM GOAL #2   Title The patient will complete Hierarchal Speech Loudness reading drills (words/phrases, sentences, and paragraph) at average 75 dB and with loud, good quality voice.     Status Achieved   SLP LONG TERM GOAL #3   Title The patient will complete homework daily.   Status Achieved   SLP LONG TERM GOAL #4   Title The patient will participate in conversation, maintaining average loudness of 75 dB and loud, good quality voice.   Status Achieved          Plan - 11/25/15 1238    Clinical Impression Statement The patient has completed the LSVT-LOUD program and has met his goals.  He is completing daily tasks and hierarchal speech drill tasks with loud,  good quality voice and demonstrates generalization into conversational speech.   Speech Therapy Frequency 4x / week   Duration 4 weeks   Treatment/Interventions Other (comment)  LSVT-LOUD   Potential to Achieve Goals Good   Potential Considerations Ability to learn/carryover information;Co-morbidities;Cooperation/participation level;Medical prognosis;Previous level of function;Severity of impairments;Family/community support   SLP Home Exercise Plan LSVT-LOUD "forever" homework   Consulted and Agree with Plan of Care Patient      Patient will benefit from skilled therapeutic intervention in order to improve  the following deficits and impairments:   Dysphonia      G-Codes - 12-20-15 1239    Functional Assessment Tool Used LSVT-LOUD protocol, clinical judgment   Functional Limitations Voice   Voice Current Status (G9171) At least 1 percent but less than 20 percent impaired, limited or restricted   Voice Goal Status (G9172) At least 1 percent but less than 20 percent impaired, limited or restricted   Voice Discharge Status (G3943) At least 1 percent but less than 20 percent impaired, limited or restricted      Problem List There are no active problems to display for this patient.  Leroy Sea, MS/CCC- SLP  Lou Miner Dec 20, 2015, 12:40 PM  [place] MAIN [facility] SERVICES [address] [place], [place], [address] Phone: [phone]   Fax:  [phone]   Name: EDY BELT MRN: [id number] Date of Birth: 1940/08/03

## 2015-11-25 NOTE — Therapy (Signed)
[place] MAIN [facility] SERVICES [address] [place], [place], [address] Phone: [phone]   Fax:  [phone]  Physical Therapy Treatment/Discharge summary  Patient Details  Name: Barry Joyce MRN: [id number] Date of Birth: 1941/03/05 (age 75) Referring Provider: Manuella Ghazi  Encounter Date: 11/25/2015      PT End of Session - 11/25/15 1027    Visit Number 19   Number of Visits 17   Date for PT Re-Evaluation 11/26/15   PT Start Time 1105   PT Stop Time 1200   PT Time Calculation (min) 55 min   Equipment Utilized During Treatment Gait belt   Activity Tolerance Patient tolerated treatment well      Past Medical History  Diagnosis Date  . GERD (gastroesophageal reflux disease)   . Parkinson disease ([place])   . Sarcoidosis ([place])     History reviewed. No pertinent past surgical history.  There were no vitals filed for this visit.      Subjective Assessment - 11/25/15 1110    Subjective Patient is ready to begin LSVT BIG. He is doing his exercises.    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 No/denies       OUTCOME MEASURES: TEST Outcome Interpretation  5 times sit<>stand 8.72 sec >60 yo, >15 sec indicates increased risk for falls  10 meter walk test  2.21 m/s <1.0 m/s indicates increased risk for falls; limited community ambulator  Timed up and Go  5.23 sec <14 sec indicates increased risk for falls  6 minute walk test  1700 Feet 1000 feet is community Water quality scientist 56/56 <36/56 (100% risk for falls), 37-45 (80% risk for falls); 46-51 (>50% risk for falls); 52-55 (lower risk <25% of falls)  9 Hole Peg Test L: 25.24R23.07                 Patient seen for LSVT Daily Session Maximal Daily Exercises for facilitation/coordination of movement Sustained movements are designed  to rescale the amplitude of movement output for generalization to daily functional activities .Performed as follows for 1 set of 10 repetitions each multidirectional sustained movements 1) Floor to ceiling 2) Side to side multidirectional Repetitive movements performed in standing and are designed to provide retraining effort needed for sustained muscle activation in tasks   3) Step and reach forward 4) Step and reach backwards 5) Step and reach sideways 6) Rock and reach forward/backward 7) Rock and reach sideways Sit to stand functional component task with supervision 5 reps and simulated activities for asecnding/ descending steps, reaching low and high and single leg activities.  CGA to SBA for safety with activities.  Uses to increase intensity and amplitude of movements                             PT Education - 11/25/15 1026    Education provided Yes   Education Details LSVT BIG   Person(s) Educated Patient   Methods Explanation   Comprehension Verbalized understanding             PT Long Term Goals - 11/25/15 1027    PT LONG TERM GOAL #1   Title Patient will be independent in home exercise program to improve strength/mobility for better functional independence with ADLs   Status Achieved   PT LONG TERM GOAL #2   Title Patient will tolerate 5 seconds of single  leg stance without loss of balance to improve ability to get in and out of shower safely   Status Achieved   PT LONG TERM GOAL #3   Title Patient will ascend/descend 4 stairs without rail assist independently without loss of balance to improve ability to get in/out of home   Status Achieved               Plan - 12-18-15 1110    Clinical Impression Statement Patient  has met his goals and will be DC from LSVT BIG today. He is independent with HEP.    Rehab Potential Good   PT Frequency 4x / week   PT Duration 4 weeks   PT Treatment/Interventions Balance training;Therapeutic  exercise;Therapeutic activities   PT Next Visit Plan LSVT BIG   Consulted and Agree with Plan of Care Patient      Patient will benefit from skilled therapeutic intervention in order to improve the following deficits and impairments:  Decreased balance, Difficulty walking, Decreased coordination  Visit Diagnosis: Unsteadiness on feet  Difficulty in walking, not elsewhere classified       G-Codes - 2015/12/18 1111    Functional Assessment Tool Used 6 mw, berg balance   Functional Limitation Mobility: Walking and moving around   Mobility: Walking and Moving Around Current Status [phone]) 0 percent impaired, limited or restricted   Mobility: Walking and Moving Around Goal Status (H5844) 0 percent impaired, limited or restricted   Mobility: Walking and Moving Around Discharge Status [phone]) 0 percent impaired, limited or restricted      Problem List There are no active problems to display for this patient.  Alanson Puls, PT, DPT [place], Minette Headland S 2015/12/18, 11:23 AM  [place] MAIN [facility] SERVICES [address] [place], [place], [address] Phone: [phone]   Fax:  [phone]  Name: AHMAD VANWEY MRN: [id number] Date of Birth: 1940-08-14

## 2015-11-26 ENCOUNTER — Ambulatory Visit: Payer: Commercial Managed Care - HMO | Admitting: Speech Pathology

## 2015-12-01 ENCOUNTER — Ambulatory Visit: Payer: Commercial Managed Care - HMO | Admitting: Speech Pathology

## 2015-12-03 ENCOUNTER — Ambulatory Visit: Payer: Commercial Managed Care - HMO | Admitting: Speech Pathology

## 2015-12-06 ENCOUNTER — Ambulatory Visit: Payer: Commercial Managed Care - HMO | Admitting: Speech Pathology

## 2015-12-22 ENCOUNTER — Encounter: Payer: Commercial Managed Care - HMO | Admitting: Occupational Therapy

## 2016-03-20 DIAGNOSIS — H35373 Puckering of macula, bilateral: Secondary | ICD-10-CM | POA: Diagnosis not present

## 2016-04-24 DIAGNOSIS — D2272 Melanocytic nevi of left lower limb, including hip: Secondary | ICD-10-CM | POA: Diagnosis not present

## 2016-04-24 DIAGNOSIS — X32XXXA Exposure to sunlight, initial encounter: Secondary | ICD-10-CM | POA: Diagnosis not present

## 2016-04-24 DIAGNOSIS — D485 Neoplasm of uncertain behavior of skin: Secondary | ICD-10-CM | POA: Diagnosis not present

## 2016-04-24 DIAGNOSIS — L570 Actinic keratosis: Secondary | ICD-10-CM | POA: Diagnosis not present

## 2016-04-24 DIAGNOSIS — D225 Melanocytic nevi of trunk: Secondary | ICD-10-CM | POA: Diagnosis not present

## 2016-04-24 DIAGNOSIS — C44229 Squamous cell carcinoma of skin of left ear and external auricular canal: Secondary | ICD-10-CM | POA: Diagnosis not present

## 2016-04-24 DIAGNOSIS — D2261 Melanocytic nevi of right upper limb, including shoulder: Secondary | ICD-10-CM | POA: Diagnosis not present

## 2016-04-24 DIAGNOSIS — Z85828 Personal history of other malignant neoplasm of skin: Secondary | ICD-10-CM | POA: Diagnosis not present

## 2016-05-15 DIAGNOSIS — G20 Parkinson's disease: Secondary | ICD-10-CM | POA: Diagnosis not present

## 2016-06-23 DIAGNOSIS — C44229 Squamous cell carcinoma of skin of left ear and external auricular canal: Secondary | ICD-10-CM | POA: Diagnosis not present

## 2016-09-25 DIAGNOSIS — H35373 Puckering of macula, bilateral: Secondary | ICD-10-CM | POA: Diagnosis not present

## 2016-10-23 DIAGNOSIS — X32XXXA Exposure to sunlight, initial encounter: Secondary | ICD-10-CM | POA: Diagnosis not present

## 2016-10-23 DIAGNOSIS — Z872 Personal history of diseases of the skin and subcutaneous tissue: Secondary | ICD-10-CM | POA: Diagnosis not present

## 2016-10-23 DIAGNOSIS — L821 Other seborrheic keratosis: Secondary | ICD-10-CM | POA: Diagnosis not present

## 2016-10-23 DIAGNOSIS — D225 Melanocytic nevi of trunk: Secondary | ICD-10-CM | POA: Diagnosis not present

## 2016-10-23 DIAGNOSIS — Z85828 Personal history of other malignant neoplasm of skin: Secondary | ICD-10-CM | POA: Diagnosis not present

## 2016-10-23 DIAGNOSIS — L570 Actinic keratosis: Secondary | ICD-10-CM | POA: Diagnosis not present

## 2016-11-05 IMAGING — MR MR HEAD W/O CM
9 of 10 series · 36 of 48 positions shown · non-contrast
Comparison: None.

CLINICAL DATA: 74-year-old male off balance for several weeks.
Initial encounter.

EXAM:
MRI HEAD WITHOUT CONTRAST
TECHNIQUE: Multiplanar, multiecho pulse sequences of the brain and surrounding
structures were obtained without intravenous contrast.

[Series 2: T1 · sagittal · 5.0mm · 0.45mm/px · 3 of 27 slices shown (1 of 2)]
[im 1/27]
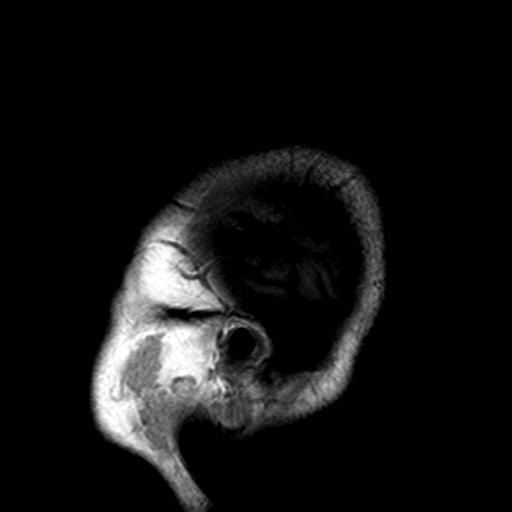
[im 14/27]
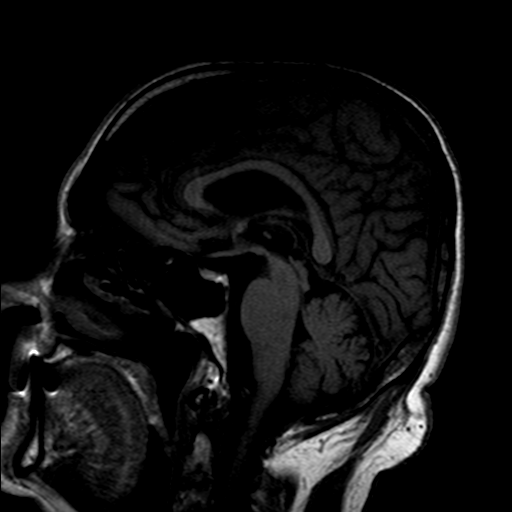
[im 27/27]
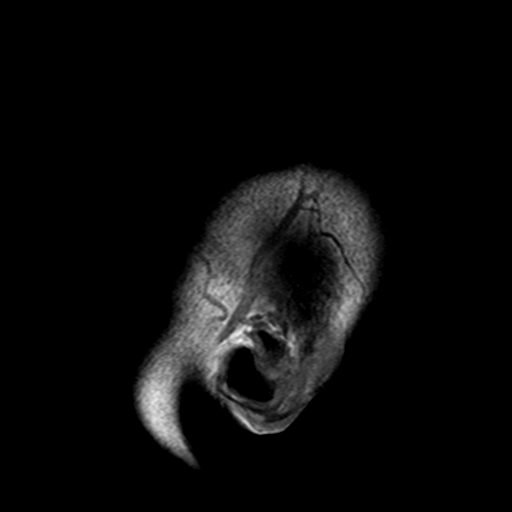

[Series 4: DWI · axial · 4.0mm · 0.94mm/px · z∈[-59,+107]mm · 5 of 44 slices shown (1 of 3)]
[im 1/44]
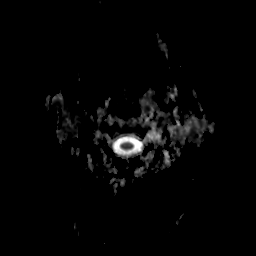
[im 11/44]
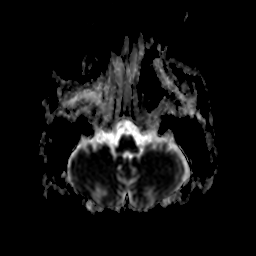
[im 22/44]
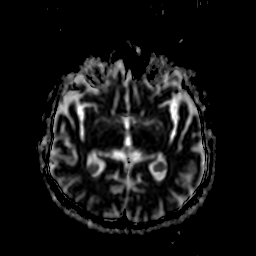
[im 33/44]
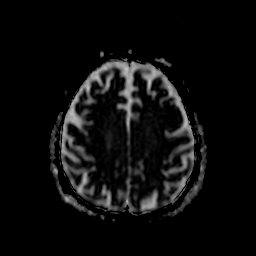
[im 44/44]
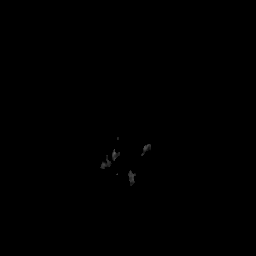

[Series 6: DWI · coronal · 5.0mm · 1.80mm/px · 4 of 37 slices shown (2 of 3)]
[im 1/37]
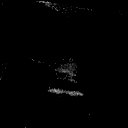
[im 13/37]
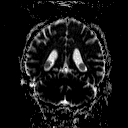
[im 25/37]
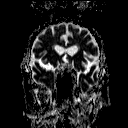
[im 37/37]
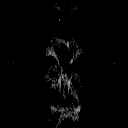

[Series 8: T2 · axial · 5.0mm · 0.45mm/px · z∈[-55,+108]mm · 4 of 27 slices shown (1 of 3)]
[im 1/27]
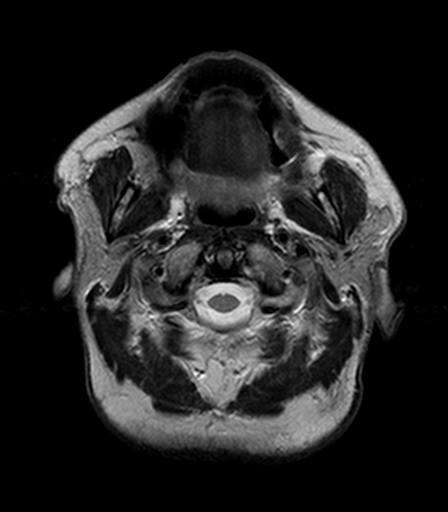
[im 9/27]
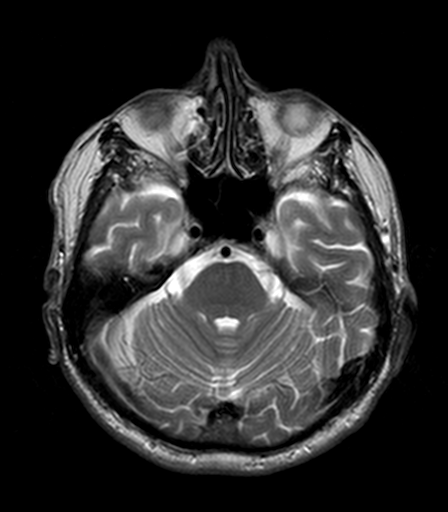
[im 18/27]
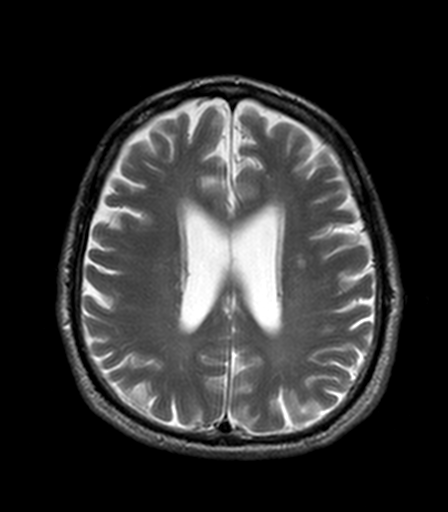
[im 27/27]
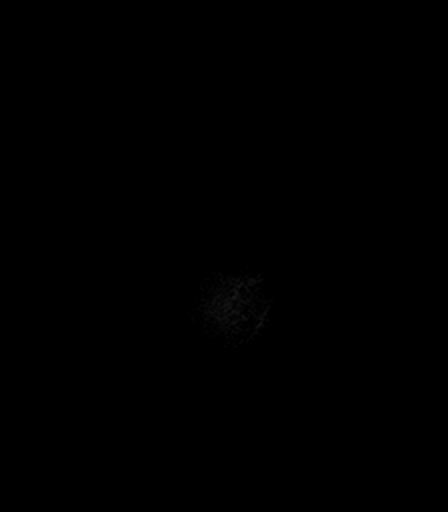

[Series 9: FLAIR · axial · 5.0mm · 0.90mm/px · z∈[-55,+108]mm · 4 of 27 slices shown]
[im 1/27]
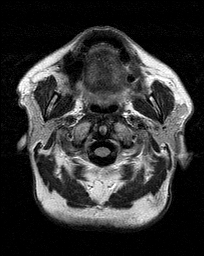
[im 9/27]
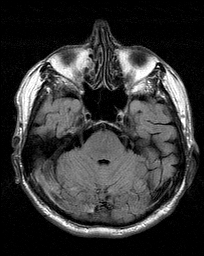
[im 18/27]
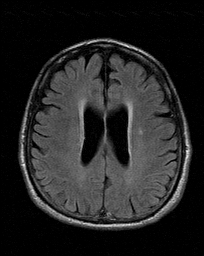
[im 27/27]
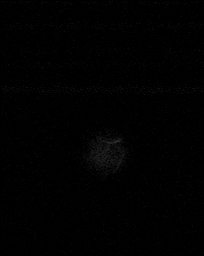

[Series 10: DWI · coronal · 5.0mm · 1.80mm/px · 5 of 37 slices shown (3 of 3)]
[im 1/37]
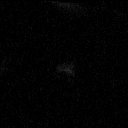
[im 10/37]
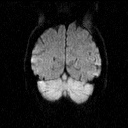
[im 19/37]
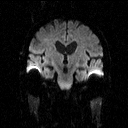
[im 28/37]
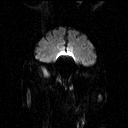
[im 37/37]
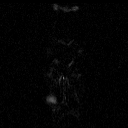

[Series 11: T2 · axial · 5.0mm · 0.45mm/px · z∈[-59,+104]mm · 4 of 27 slices shown (2 of 3)]
[im 1/27]
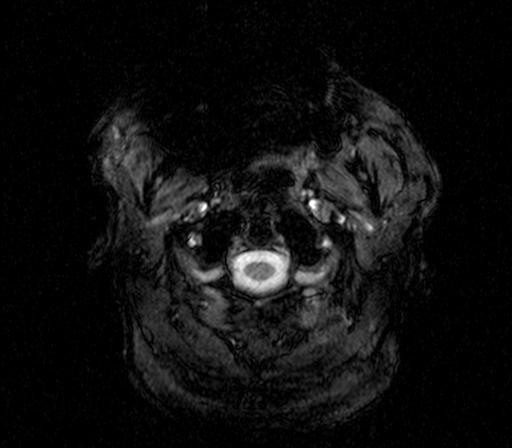
[im 9/27]
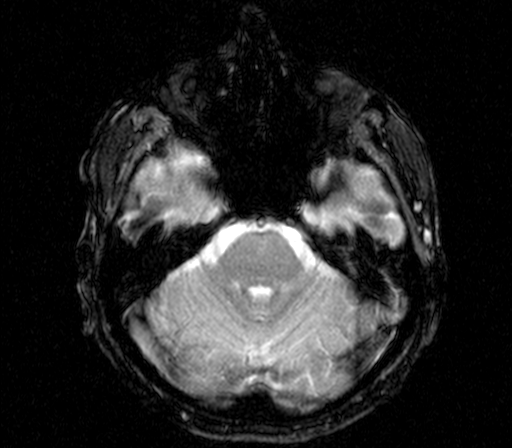
[im 18/27]
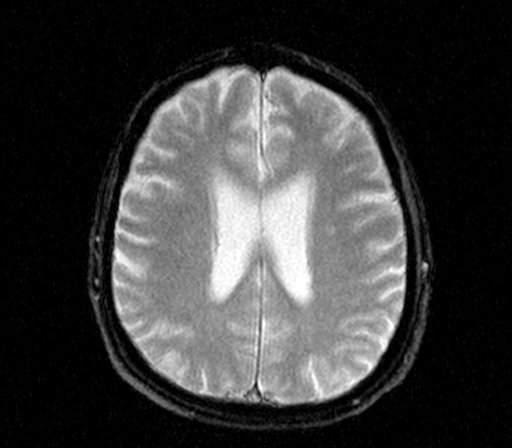
[im 27/27]
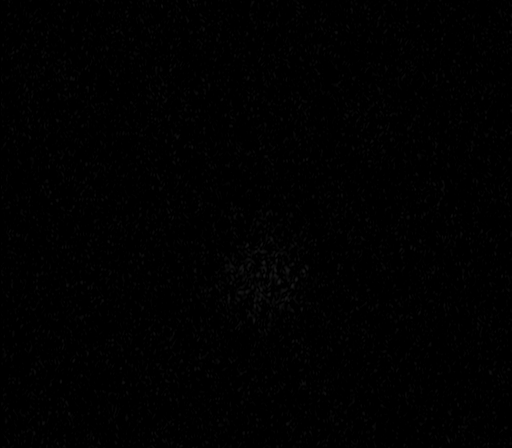

[Series 12: T1 · axial · 3.0mm · 0.45mm/px · z∈[-65,-22]mm · 3 of 64 slices shown (2 of 2)]
[im 1/64]
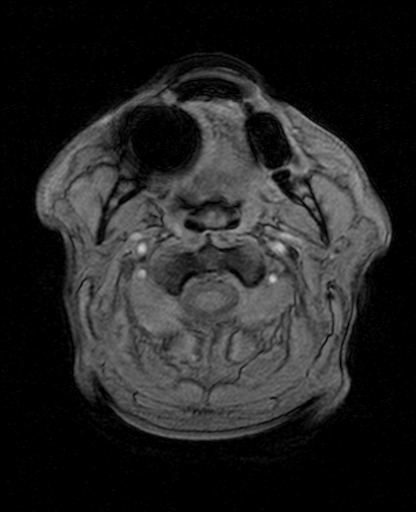
[im 8/64]
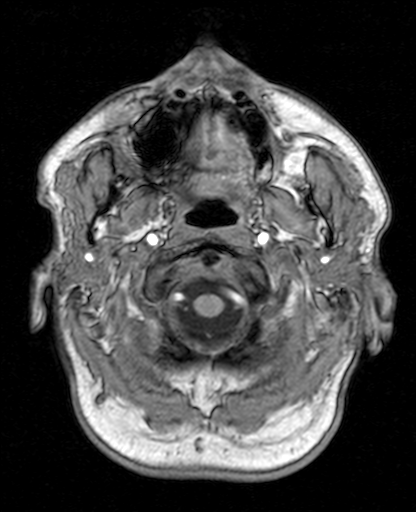
[im 16/64]
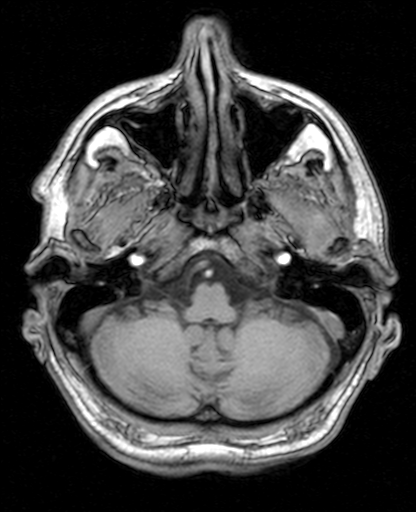

[Series 13: T2 · coronal · 5.0mm · 0.45mm/px · 4 of 27 slices shown (3 of 3)]
[im 1/27]
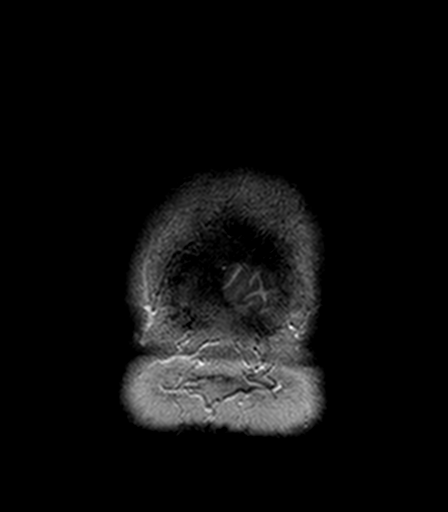
[im 9/27]
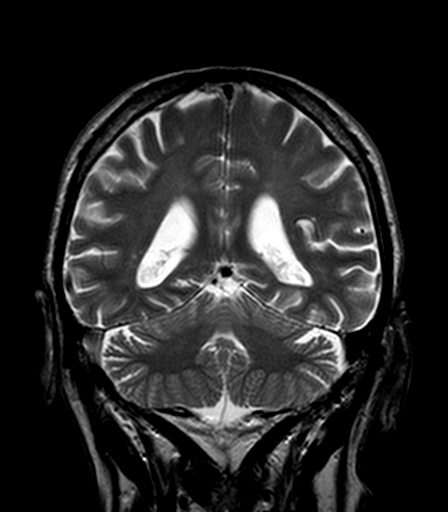
[im 18/27]
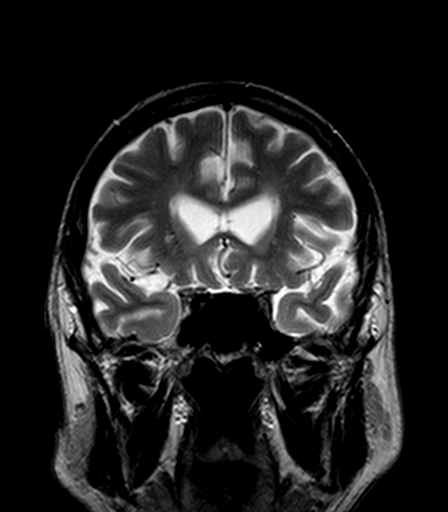
[im 27/27]
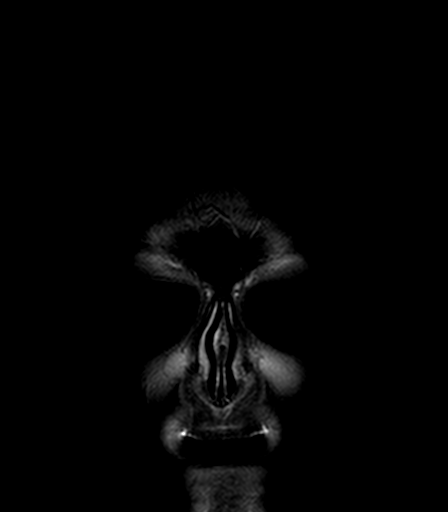

[36 of 48 positions shown; findings below may reference images not displayed]

FINDINGS: No acute infarct or intracranial hemorrhage.

Mild global atrophy without hydrocephalus.

Mild chronic small vessel disease changes.

No intracranial mass lesion noted on this unenhanced exam.

Major intracranial vascular structures are patent.

Post lens replacement otherwise orbital structures unremarkable.

Cervical medullary junction within normal limits.

Minimal to mild paranasal sinus mucosal thickening.
IMPRESSION: No acute infarct or intracranial hemorrhage.

Mild global atrophy without hydrocephalus.

Mild chronic small vessel disease changes.

## 2016-11-13 DIAGNOSIS — G20 Parkinson's disease: Secondary | ICD-10-CM | POA: Diagnosis not present

## 2016-12-20 DIAGNOSIS — Z0000 Encounter for general adult medical examination without abnormal findings: Secondary | ICD-10-CM | POA: Diagnosis not present

## 2016-12-20 DIAGNOSIS — G20 Parkinson's disease: Secondary | ICD-10-CM | POA: Diagnosis not present

## 2016-12-20 DIAGNOSIS — Z1211 Encounter for screening for malignant neoplasm of colon: Secondary | ICD-10-CM | POA: Diagnosis not present

## 2016-12-20 DIAGNOSIS — Z125 Encounter for screening for malignant neoplasm of prostate: Secondary | ICD-10-CM | POA: Diagnosis not present

## 2016-12-20 DIAGNOSIS — E7800 Pure hypercholesterolemia, unspecified: Secondary | ICD-10-CM | POA: Diagnosis not present

## 2016-12-20 DIAGNOSIS — Z79899 Other long term (current) drug therapy: Secondary | ICD-10-CM | POA: Diagnosis not present

## 2016-12-20 DIAGNOSIS — E559 Vitamin D deficiency, unspecified: Secondary | ICD-10-CM | POA: Diagnosis not present

## 2016-12-29 DIAGNOSIS — Z1211 Encounter for screening for malignant neoplasm of colon: Secondary | ICD-10-CM | POA: Diagnosis not present

## 2017-01-19 DIAGNOSIS — R972 Elevated prostate specific antigen [PSA]: Secondary | ICD-10-CM | POA: Diagnosis not present

## 2017-03-28 DIAGNOSIS — H35373 Puckering of macula, bilateral: Secondary | ICD-10-CM | POA: Diagnosis not present

## 2017-06-15 DIAGNOSIS — G20 Parkinson's disease: Secondary | ICD-10-CM | POA: Diagnosis not present

## 2017-06-21 DIAGNOSIS — Z79899 Other long term (current) drug therapy: Secondary | ICD-10-CM | POA: Diagnosis not present

## 2017-06-21 DIAGNOSIS — E7800 Pure hypercholesterolemia, unspecified: Secondary | ICD-10-CM | POA: Diagnosis not present

## 2017-06-21 DIAGNOSIS — Z125 Encounter for screening for malignant neoplasm of prostate: Secondary | ICD-10-CM | POA: Diagnosis not present

## 2017-06-21 DIAGNOSIS — E559 Vitamin D deficiency, unspecified: Secondary | ICD-10-CM | POA: Diagnosis not present

## 2017-06-21 DIAGNOSIS — R972 Elevated prostate specific antigen [PSA]: Secondary | ICD-10-CM | POA: Diagnosis not present

## 2017-06-21 DIAGNOSIS — G20 Parkinson's disease: Secondary | ICD-10-CM | POA: Diagnosis not present

## 2017-07-09 DIAGNOSIS — D485 Neoplasm of uncertain behavior of skin: Secondary | ICD-10-CM | POA: Diagnosis not present

## 2017-07-09 DIAGNOSIS — H0014 Chalazion left upper eyelid: Secondary | ICD-10-CM | POA: Diagnosis not present

## 2017-07-09 DIAGNOSIS — L570 Actinic keratosis: Secondary | ICD-10-CM | POA: Diagnosis not present

## 2017-07-09 DIAGNOSIS — Z85828 Personal history of other malignant neoplasm of skin: Secondary | ICD-10-CM | POA: Diagnosis not present

## 2017-07-09 DIAGNOSIS — L986 Other infiltrative disorders of the skin and subcutaneous tissue: Secondary | ICD-10-CM | POA: Diagnosis not present

## 2017-07-09 DIAGNOSIS — D225 Melanocytic nevi of trunk: Secondary | ICD-10-CM | POA: Diagnosis not present

## 2017-07-09 DIAGNOSIS — D2261 Melanocytic nevi of right upper limb, including shoulder: Secondary | ICD-10-CM | POA: Diagnosis not present

## 2017-07-09 DIAGNOSIS — X32XXXA Exposure to sunlight, initial encounter: Secondary | ICD-10-CM | POA: Diagnosis not present

## 2017-09-11 DIAGNOSIS — H35373 Puckering of macula, bilateral: Secondary | ICD-10-CM | POA: Diagnosis not present

## 2017-12-19 DIAGNOSIS — G20 Parkinson's disease: Secondary | ICD-10-CM | POA: Diagnosis not present

## 2018-02-20 DIAGNOSIS — Z1211 Encounter for screening for malignant neoplasm of colon: Secondary | ICD-10-CM | POA: Diagnosis not present

## 2018-02-20 DIAGNOSIS — E559 Vitamin D deficiency, unspecified: Secondary | ICD-10-CM | POA: Diagnosis not present

## 2018-02-20 DIAGNOSIS — G20 Parkinson's disease: Secondary | ICD-10-CM | POA: Diagnosis not present

## 2018-02-20 DIAGNOSIS — Z79899 Other long term (current) drug therapy: Secondary | ICD-10-CM | POA: Diagnosis not present

## 2018-02-20 DIAGNOSIS — Z125 Encounter for screening for malignant neoplasm of prostate: Secondary | ICD-10-CM | POA: Diagnosis not present

## 2018-02-20 DIAGNOSIS — Z0001 Encounter for general adult medical examination with abnormal findings: Secondary | ICD-10-CM | POA: Diagnosis not present

## 2018-02-20 DIAGNOSIS — E7800 Pure hypercholesterolemia, unspecified: Secondary | ICD-10-CM | POA: Diagnosis not present

## 2018-03-06 DIAGNOSIS — Z79899 Other long term (current) drug therapy: Secondary | ICD-10-CM | POA: Diagnosis not present

## 2018-03-06 DIAGNOSIS — Z125 Encounter for screening for malignant neoplasm of prostate: Secondary | ICD-10-CM | POA: Diagnosis not present

## 2018-03-06 DIAGNOSIS — E7800 Pure hypercholesterolemia, unspecified: Secondary | ICD-10-CM | POA: Diagnosis not present

## 2018-03-11 DIAGNOSIS — L570 Actinic keratosis: Secondary | ICD-10-CM | POA: Diagnosis not present

## 2018-03-11 DIAGNOSIS — D2261 Melanocytic nevi of right upper limb, including shoulder: Secondary | ICD-10-CM | POA: Diagnosis not present

## 2018-03-11 DIAGNOSIS — L821 Other seborrheic keratosis: Secondary | ICD-10-CM | POA: Diagnosis not present

## 2018-03-11 DIAGNOSIS — Z09 Encounter for follow-up examination after completed treatment for conditions other than malignant neoplasm: Secondary | ICD-10-CM | POA: Diagnosis not present

## 2018-03-11 DIAGNOSIS — Z872 Personal history of diseases of the skin and subcutaneous tissue: Secondary | ICD-10-CM | POA: Diagnosis not present

## 2018-03-11 DIAGNOSIS — L608 Other nail disorders: Secondary | ICD-10-CM | POA: Diagnosis not present

## 2018-03-11 DIAGNOSIS — Z85828 Personal history of other malignant neoplasm of skin: Secondary | ICD-10-CM | POA: Diagnosis not present

## 2018-03-11 DIAGNOSIS — D2262 Melanocytic nevi of left upper limb, including shoulder: Secondary | ICD-10-CM | POA: Diagnosis not present

## 2018-03-11 DIAGNOSIS — D225 Melanocytic nevi of trunk: Secondary | ICD-10-CM | POA: Diagnosis not present

## 2018-03-12 DIAGNOSIS — Z1211 Encounter for screening for malignant neoplasm of colon: Secondary | ICD-10-CM | POA: Diagnosis not present

## 2018-03-14 DIAGNOSIS — H35373 Puckering of macula, bilateral: Secondary | ICD-10-CM | POA: Diagnosis not present

## 2018-03-22 DIAGNOSIS — G20 Parkinson's disease: Secondary | ICD-10-CM | POA: Diagnosis not present

## 2019-02-20 DIAGNOSIS — H35373 Puckering of macula, bilateral: Secondary | ICD-10-CM | POA: Diagnosis not present

## 2019-03-05 DIAGNOSIS — N138 Other obstructive and reflux uropathy: Secondary | ICD-10-CM | POA: Diagnosis not present

## 2019-03-05 DIAGNOSIS — Z125 Encounter for screening for malignant neoplasm of prostate: Secondary | ICD-10-CM | POA: Diagnosis not present

## 2019-03-05 DIAGNOSIS — Z1211 Encounter for screening for malignant neoplasm of colon: Secondary | ICD-10-CM | POA: Diagnosis not present

## 2019-03-05 DIAGNOSIS — Z0000 Encounter for general adult medical examination without abnormal findings: Secondary | ICD-10-CM | POA: Diagnosis not present

## 2019-03-05 DIAGNOSIS — Z79899 Other long term (current) drug therapy: Secondary | ICD-10-CM | POA: Diagnosis not present

## 2019-03-05 DIAGNOSIS — E559 Vitamin D deficiency, unspecified: Secondary | ICD-10-CM | POA: Diagnosis not present

## 2019-03-05 DIAGNOSIS — E7800 Pure hypercholesterolemia, unspecified: Secondary | ICD-10-CM | POA: Diagnosis not present

## 2019-03-05 DIAGNOSIS — N401 Enlarged prostate with lower urinary tract symptoms: Secondary | ICD-10-CM | POA: Diagnosis not present

## 2019-03-05 DIAGNOSIS — G20 Parkinson's disease: Secondary | ICD-10-CM | POA: Diagnosis not present

## 2019-03-11 DIAGNOSIS — D2261 Melanocytic nevi of right upper limb, including shoulder: Secondary | ICD-10-CM | POA: Diagnosis not present

## 2019-03-11 DIAGNOSIS — Z08 Encounter for follow-up examination after completed treatment for malignant neoplasm: Secondary | ICD-10-CM | POA: Diagnosis not present

## 2019-03-11 DIAGNOSIS — Z85828 Personal history of other malignant neoplasm of skin: Secondary | ICD-10-CM | POA: Diagnosis not present

## 2019-03-11 DIAGNOSIS — D2262 Melanocytic nevi of left upper limb, including shoulder: Secondary | ICD-10-CM | POA: Diagnosis not present

## 2019-03-11 DIAGNOSIS — X32XXXA Exposure to sunlight, initial encounter: Secondary | ICD-10-CM | POA: Diagnosis not present

## 2019-03-11 DIAGNOSIS — D2272 Melanocytic nevi of left lower limb, including hip: Secondary | ICD-10-CM | POA: Diagnosis not present

## 2019-03-11 DIAGNOSIS — L570 Actinic keratosis: Secondary | ICD-10-CM | POA: Diagnosis not present

## 2019-03-11 DIAGNOSIS — L821 Other seborrheic keratosis: Secondary | ICD-10-CM | POA: Diagnosis not present

## 2019-03-14 DIAGNOSIS — Z1211 Encounter for screening for malignant neoplasm of colon: Secondary | ICD-10-CM | POA: Diagnosis not present

## 2019-03-20 DIAGNOSIS — H02883 Meibomian gland dysfunction of right eye, unspecified eyelid: Secondary | ICD-10-CM | POA: Diagnosis not present

## 2019-03-20 DIAGNOSIS — H01004 Unspecified blepharitis left upper eyelid: Secondary | ICD-10-CM | POA: Diagnosis not present

## 2019-03-20 DIAGNOSIS — H02886 Meibomian gland dysfunction of left eye, unspecified eyelid: Secondary | ICD-10-CM | POA: Diagnosis not present

## 2019-08-25 DIAGNOSIS — Z125 Encounter for screening for malignant neoplasm of prostate: Secondary | ICD-10-CM | POA: Diagnosis not present

## 2019-08-25 DIAGNOSIS — E7800 Pure hypercholesterolemia, unspecified: Secondary | ICD-10-CM | POA: Diagnosis not present

## 2019-08-25 DIAGNOSIS — Z79899 Other long term (current) drug therapy: Secondary | ICD-10-CM | POA: Diagnosis not present

## 2019-09-01 DIAGNOSIS — D649 Anemia, unspecified: Secondary | ICD-10-CM | POA: Diagnosis not present

## 2019-09-01 DIAGNOSIS — Z79899 Other long term (current) drug therapy: Secondary | ICD-10-CM | POA: Diagnosis not present

## 2019-09-01 DIAGNOSIS — E785 Hyperlipidemia, unspecified: Secondary | ICD-10-CM | POA: Diagnosis not present

## 2019-09-01 DIAGNOSIS — G20 Parkinson's disease: Secondary | ICD-10-CM | POA: Diagnosis not present

## 2019-09-01 DIAGNOSIS — Z0000 Encounter for general adult medical examination without abnormal findings: Secondary | ICD-10-CM | POA: Diagnosis not present

## 2019-09-01 DIAGNOSIS — E559 Vitamin D deficiency, unspecified: Secondary | ICD-10-CM | POA: Diagnosis not present

## 2019-09-01 DIAGNOSIS — N401 Enlarged prostate with lower urinary tract symptoms: Secondary | ICD-10-CM | POA: Diagnosis not present

## 2019-09-01 DIAGNOSIS — F0280 Dementia in other diseases classified elsewhere without behavioral disturbance: Secondary | ICD-10-CM | POA: Diagnosis not present

## 2019-09-16 DIAGNOSIS — G20 Parkinson's disease: Secondary | ICD-10-CM | POA: Diagnosis not present

## 2019-12-02 DIAGNOSIS — E559 Vitamin D deficiency, unspecified: Secondary | ICD-10-CM | POA: Diagnosis not present

## 2019-12-02 DIAGNOSIS — Z79899 Other long term (current) drug therapy: Secondary | ICD-10-CM | POA: Diagnosis not present

## 2020-01-29 DIAGNOSIS — D225 Melanocytic nevi of trunk: Secondary | ICD-10-CM | POA: Diagnosis not present

## 2020-01-29 DIAGNOSIS — Z85828 Personal history of other malignant neoplasm of skin: Secondary | ICD-10-CM | POA: Diagnosis not present

## 2020-01-29 DIAGNOSIS — X32XXXA Exposure to sunlight, initial encounter: Secondary | ICD-10-CM | POA: Diagnosis not present

## 2020-01-29 DIAGNOSIS — D2261 Melanocytic nevi of right upper limb, including shoulder: Secondary | ICD-10-CM | POA: Diagnosis not present

## 2020-01-29 DIAGNOSIS — L570 Actinic keratosis: Secondary | ICD-10-CM | POA: Diagnosis not present

## 2020-01-29 DIAGNOSIS — D2262 Melanocytic nevi of left upper limb, including shoulder: Secondary | ICD-10-CM | POA: Diagnosis not present

## 2020-01-29 DIAGNOSIS — B354 Tinea corporis: Secondary | ICD-10-CM | POA: Diagnosis not present

## 2020-01-29 DIAGNOSIS — C44329 Squamous cell carcinoma of skin of other parts of face: Secondary | ICD-10-CM | POA: Diagnosis not present

## 2020-01-29 DIAGNOSIS — D485 Neoplasm of uncertain behavior of skin: Secondary | ICD-10-CM | POA: Diagnosis not present

## 2020-01-29 DIAGNOSIS — D2271 Melanocytic nevi of right lower limb, including hip: Secondary | ICD-10-CM | POA: Diagnosis not present

## 2020-02-11 DIAGNOSIS — H35373 Puckering of macula, bilateral: Secondary | ICD-10-CM | POA: Diagnosis not present

## 2020-02-11 DIAGNOSIS — H01009 Unspecified blepharitis unspecified eye, unspecified eyelid: Secondary | ICD-10-CM | POA: Diagnosis not present

## 2020-03-03 DIAGNOSIS — C44329 Squamous cell carcinoma of skin of other parts of face: Secondary | ICD-10-CM | POA: Diagnosis not present

## 2020-03-15 DIAGNOSIS — Z23 Encounter for immunization: Secondary | ICD-10-CM | POA: Diagnosis not present

## 2020-03-15 DIAGNOSIS — Z0000 Encounter for general adult medical examination without abnormal findings: Secondary | ICD-10-CM | POA: Diagnosis not present

## 2020-03-15 DIAGNOSIS — Z79899 Other long term (current) drug therapy: Secondary | ICD-10-CM | POA: Diagnosis not present

## 2020-03-15 DIAGNOSIS — G20 Parkinson's disease: Secondary | ICD-10-CM | POA: Diagnosis not present

## 2020-03-15 DIAGNOSIS — Z1211 Encounter for screening for malignant neoplasm of colon: Secondary | ICD-10-CM | POA: Diagnosis not present

## 2020-03-22 DIAGNOSIS — G20 Parkinson's disease: Secondary | ICD-10-CM | POA: Diagnosis not present

## 2020-03-29 DIAGNOSIS — Z1211 Encounter for screening for malignant neoplasm of colon: Secondary | ICD-10-CM | POA: Diagnosis not present

## 2020-05-04 ENCOUNTER — Ambulatory Visit (INDEPENDENT_AMBULATORY_CARE_PROVIDER_SITE_OTHER): Payer: Medicare HMO | Admitting: Podiatry

## 2020-05-04 ENCOUNTER — Ambulatory Visit: Payer: Self-pay | Admitting: Podiatry

## 2020-05-04 ENCOUNTER — Encounter: Payer: Self-pay | Admitting: Podiatry

## 2020-05-04 ENCOUNTER — Other Ambulatory Visit: Payer: Self-pay

## 2020-05-04 DIAGNOSIS — K222 Esophageal obstruction: Secondary | ICD-10-CM | POA: Insufficient documentation

## 2020-05-04 DIAGNOSIS — I8390 Asymptomatic varicose veins of unspecified lower extremity: Secondary | ICD-10-CM | POA: Insufficient documentation

## 2020-05-04 DIAGNOSIS — K5790 Diverticulosis of intestine, part unspecified, without perforation or abscess without bleeding: Secondary | ICD-10-CM | POA: Insufficient documentation

## 2020-05-04 DIAGNOSIS — L603 Nail dystrophy: Secondary | ICD-10-CM

## 2020-05-04 DIAGNOSIS — N138 Other obstructive and reflux uropathy: Secondary | ICD-10-CM | POA: Insufficient documentation

## 2020-05-04 DIAGNOSIS — Q828 Other specified congenital malformations of skin: Secondary | ICD-10-CM | POA: Diagnosis not present

## 2020-05-04 DIAGNOSIS — E785 Hyperlipidemia, unspecified: Secondary | ICD-10-CM | POA: Insufficient documentation

## 2020-05-04 DIAGNOSIS — E559 Vitamin D deficiency, unspecified: Secondary | ICD-10-CM | POA: Insufficient documentation

## 2020-05-04 NOTE — Progress Notes (Signed)
Subjective:  Patient ID: Barry Joyce, male    DOB: [birth year],  MRN: [id number]  Chief Complaint  Patient presents with  . Callouses    Patient presents today for painful callous under left 1st sub met x 2-3 months and dystrophic nail right great toenail  . Nail Problem    79 y.o. male presents with the above complaint.  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  Patient states is painful to touch.  He states that has been bothering him for quite some time.  He would like to have it removed.  He does not want a permanent.  He also has secondary complaint of left submetatarsal 1 hyperkeratotic lesion/porokeratosis.  He would like to have it debrided down as he states is very painful to walk on.  He denies any other acute complaints.   Review of Systems: Negative except as noted in the HPI. Denies N/V/F/Ch.  Past Medical History:  Diagnosis Date  . GERD (gastroesophageal reflux disease)   . Parkinson disease ([place])   . Sarcoidosis ([place])     Current Outpatient Medications:  .  erythromycin ophthalmic ointment, APPLY 1/4 INCH TO INTO BOTH EYES AT BEDTIME DAILY, Disp: , Rfl:  .  neomycin-polymyxin-dexamethasone (MAXITROL) 0.1 % ophthalmic suspension, INSTILL 1 DROP INTO BOTH EYES DAILY, Disp: , Rfl:  .  ascorbic acid (VITAMIN C) 500 MG tablet, Take by mouth., Disp: , Rfl:  .  aspirin EC 81 MG tablet, Take 81 mg by mouth daily., Disp: , Rfl:  .  carbidopa-levodopa (SINEMET IR) 25-100 MG tablet, Take 1 tablet by mouth 3 (three) times daily., Disp: , Rfl:  .  Cholecalciferol 25 MCG (1000 UT) capsule, Take by mouth., Disp: , Rfl:  .  Multiple Vitamin (MULTI-VITAMIN) tablet, Take 1 tablet by mouth daily., Disp: , Rfl:   Social History   Tobacco Use  Smoking Status Never Smoker    No Known Allergies Objective:  There were no vitals filed for this visit. There is no height or weight on file to calculate BMI. Constitutional Well developed. Well nourished.   Vascular Dorsalis pedis pulses palpable bilaterally. Posterior tibial pulses palpable bilaterally. Capillary refill normal to all digits.  No cyanosis or clubbing noted. Pedal hair growth normal.  Neurologic Normal speech. Oriented to person, place, and time. Epicritic sensation to light touch grossly present bilaterally.  Dermatologic Pain on palpation of the entire/total nail on 1st digit of the right No other open wounds. Hyperkeratotic lesion with central nucleated core noted to the left submetatarsal 1.  Pain on palpation to the lesion.  No pinpoint bleeding noted upon debridement  Orthopedic: Normal joint ROM without pain or crepitus bilaterally. No visible deformities. No bony tenderness.   Radiographs: None Assessment:   1. Porokeratosis   2. Nail dystrophy    Plan:  Patient was evaluated and treated and all questions answered.  Nail contusion/dystrophy hallux, right -Patient elects to proceed with minor surgery to remove entire toenail today. Consent reviewed and signed by patient. -Entire/total nail excised. See procedure note. -Educated on post-procedure care including soaking. Written instructions provided and reviewed. -Patient to follow up in 2 weeks for nail check.  Left submetatarsal 1 porokeratosis -I explained patient the etiology of porokeratosis versus treatment options were discussed.  I believe patient will benefit from aggressive debridement of the lesion.  Patient agrees with the plan would like to have the lesion debrided. -Using chisel blade to handle the lesion was debrided down to healthy  tissue.  No pinpoint bleeding noted.  No complications noted. -Offloading pad was dispensed.  Procedure: Excision of entire/total nail  Location: Right 1st toe digit Anesthesia: Lidocaine 1% plain; 1.5 mL and Marcaine 0.5% plain; 1.5 mL, digital block. Skin Prep: Betadine. Dressing: Silvadene; telfa; dry, sterile, compression dressing. Technique: Following skin  prep, the toe was exsanguinated and a tourniquet was secured at the base of the toe. The affected nail border was freed and excised. The tourniquet was then removed and sterile dressing applied. Disposition: Patient tolerated procedure well. Patient to return in 2 weeks for follow-up.   No follow-ups on file.

## 2020-05-04 NOTE — Patient Instructions (Signed)

## 2020-05-13 DIAGNOSIS — M545 Low back pain, unspecified: Secondary | ICD-10-CM | POA: Diagnosis not present

## 2020-07-06 DIAGNOSIS — D2271 Melanocytic nevi of right lower limb, including hip: Secondary | ICD-10-CM | POA: Diagnosis not present

## 2020-07-06 DIAGNOSIS — X32XXXA Exposure to sunlight, initial encounter: Secondary | ICD-10-CM | POA: Diagnosis not present

## 2020-07-06 DIAGNOSIS — L570 Actinic keratosis: Secondary | ICD-10-CM | POA: Diagnosis not present

## 2020-07-06 DIAGNOSIS — D2262 Melanocytic nevi of left upper limb, including shoulder: Secondary | ICD-10-CM | POA: Diagnosis not present

## 2020-07-06 DIAGNOSIS — Z85828 Personal history of other malignant neoplasm of skin: Secondary | ICD-10-CM | POA: Diagnosis not present

## 2020-07-06 DIAGNOSIS — D2261 Melanocytic nevi of right upper limb, including shoulder: Secondary | ICD-10-CM | POA: Diagnosis not present

## 2020-07-06 DIAGNOSIS — D225 Melanocytic nevi of trunk: Secondary | ICD-10-CM | POA: Diagnosis not present

## 2020-08-05 ENCOUNTER — Ambulatory Visit: Payer: Medicare HMO | Admitting: Podiatry

## 2020-08-05 ENCOUNTER — Other Ambulatory Visit: Payer: Self-pay

## 2020-08-05 DIAGNOSIS — B351 Tinea unguium: Secondary | ICD-10-CM | POA: Diagnosis not present

## 2020-08-05 DIAGNOSIS — M79675 Pain in left toe(s): Secondary | ICD-10-CM | POA: Diagnosis not present

## 2020-08-05 DIAGNOSIS — Q828 Other specified congenital malformations of skin: Secondary | ICD-10-CM | POA: Diagnosis not present

## 2020-08-05 DIAGNOSIS — M79674 Pain in right toe(s): Secondary | ICD-10-CM | POA: Diagnosis not present

## 2020-08-10 ENCOUNTER — Encounter: Payer: Self-pay | Admitting: Podiatry

## 2020-08-10 NOTE — Progress Notes (Signed)
Subjective:  Patient ID: Barry Joyce, male    DOB: [birth year],  MRN: [id number]  No chief complaint on file.   80 y.o. male presents with the above complaint.  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  Patient states is painful to touch.  He states that has been bothering him for quite some time.  He would like to have it removed.  He does not want a permanent.  He also has secondary complaint of left submetatarsal 1 hyperkeratotic lesion/porokeratosis.  He would like to have it debrided down as he states is very painful to walk on.  He denies any other acute complaints.   Review of Systems: Negative except as noted in the HPI. Denies N/V/F/Ch.  Past Medical History:  Diagnosis Date  . GERD (gastroesophageal reflux disease)   . Parkinson disease ([place])   . Sarcoidosis     Current Outpatient Medications:  .  ascorbic acid (VITAMIN C) 500 MG tablet, Take by mouth., Disp: , Rfl:  .  aspirin EC 81 MG tablet, Take 81 mg by mouth daily., Disp: , Rfl:  .  carbidopa-levodopa (SINEMET IR) 25-100 MG tablet, Take 1 tablet by mouth 3 (three) times daily., Disp: , Rfl:  .  Cholecalciferol 25 MCG (1000 UT) capsule, Take by mouth., Disp: , Rfl:  .  erythromycin ophthalmic ointment, APPLY 1/4 INCH TO INTO BOTH EYES AT BEDTIME DAILY, Disp: , Rfl:  .  Multiple Vitamin (MULTI-VITAMIN) tablet, Take 1 tablet by mouth daily., Disp: , Rfl:  .  neomycin-polymyxin-dexamethasone (MAXITROL) 0.1 % ophthalmic suspension, INSTILL 1 DROP INTO BOTH EYES DAILY, Disp: , Rfl:   Social History   Tobacco Use  Smoking Status Never Smoker  Smokeless Tobacco Not on file    No Known Allergies Objective:  There were no vitals filed for this visit. There is no height or weight on file to calculate BMI. Constitutional Well developed. Well nourished.  Vascular Dorsalis pedis pulses palpable bilaterally. Posterior tibial pulses palpable bilaterally. Capillary refill normal to all  digits.  No cyanosis or clubbing noted. Pedal hair growth normal.  Neurologic Normal speech. Oriented to person, place, and time. Epicritic sensation to light touch grossly present bilaterally.  Dermatologic Pain on palpation of the entire/total nail on 1st digit of the right No other open wounds. Hyperkeratotic lesion with central nucleated core noted to the left submetatarsal 1.  Pain on palpation to the lesion.  No pinpoint bleeding noted upon debridement  Orthopedic: Normal joint ROM without pain or crepitus bilaterally. No visible deformities. No bony tenderness.   Radiographs: None Assessment:   1. Porokeratosis   2. Pain due to onychomycosis of toenails of both feet    Plan:  Patient was evaluated and treated and all questions answered.  Nail contusion/dystrophy hallux, right -Healed  Left submetatarsal 1 porokeratosis -I explained patient the etiology of porokeratosis versus treatment options were discussed.  I believe patient will benefit from aggressive debridement of the lesion.  Patient agrees with the plan would like to have the lesion debrided. -Using chisel blade to handle the lesion was debrided down to healthy tissue.  No pinpoint bleeding noted.  No complications noted. -Offloading pad was dispensed.  Procedure: Excision of entire/total nail  Location: Right 1st toe digit Anesthesia: Lidocaine 1% plain; 1.5 mL and Marcaine 0.5% plain; 1.5 mL, digital block. Skin Prep: Betadine. Dressing: Silvadene; telfa; dry, sterile, compression dressing. Technique: Following skin prep, the toe was exsanguinated and a tourniquet was secured at  the base of the toe. The affected nail border was freed and excised. The tourniquet was then removed and sterile dressing applied. Disposition: Patient tolerated procedure well. Patient to return in 2 weeks for follow-up.   No follow-ups on file.

## 2020-09-13 DIAGNOSIS — E559 Vitamin D deficiency, unspecified: Secondary | ICD-10-CM | POA: Diagnosis not present

## 2020-09-13 DIAGNOSIS — G20 Parkinson's disease: Secondary | ICD-10-CM | POA: Diagnosis not present

## 2020-09-13 DIAGNOSIS — Z79899 Other long term (current) drug therapy: Secondary | ICD-10-CM | POA: Diagnosis not present

## 2020-09-13 DIAGNOSIS — N138 Other obstructive and reflux uropathy: Secondary | ICD-10-CM | POA: Diagnosis not present

## 2020-09-13 DIAGNOSIS — E7800 Pure hypercholesterolemia, unspecified: Secondary | ICD-10-CM | POA: Diagnosis not present

## 2020-09-13 DIAGNOSIS — Z125 Encounter for screening for malignant neoplasm of prostate: Secondary | ICD-10-CM | POA: Diagnosis not present

## 2020-09-13 DIAGNOSIS — N401 Enlarged prostate with lower urinary tract symptoms: Secondary | ICD-10-CM | POA: Diagnosis not present

## 2020-09-20 DIAGNOSIS — G20 Parkinson's disease: Secondary | ICD-10-CM | POA: Diagnosis not present

## 2020-11-04 ENCOUNTER — Ambulatory Visit: Payer: Medicare HMO | Admitting: Podiatry

## 2020-11-04 ENCOUNTER — Encounter: Payer: Self-pay | Admitting: Podiatry

## 2020-11-04 ENCOUNTER — Other Ambulatory Visit: Payer: Self-pay

## 2020-11-04 DIAGNOSIS — Q828 Other specified congenital malformations of skin: Secondary | ICD-10-CM

## 2020-11-04 DIAGNOSIS — B351 Tinea unguium: Secondary | ICD-10-CM

## 2020-11-04 DIAGNOSIS — M79674 Pain in right toe(s): Secondary | ICD-10-CM | POA: Diagnosis not present

## 2020-11-04 DIAGNOSIS — M79675 Pain in left toe(s): Secondary | ICD-10-CM | POA: Diagnosis not present

## 2020-11-04 DIAGNOSIS — L603 Nail dystrophy: Secondary | ICD-10-CM

## 2020-11-04 NOTE — Progress Notes (Signed)
This patient returns to the office for evaluation and treatment of long thick painful nails .  This patient is unable to trim his own nails since the patient cannot reach his feet.  Patient says the nails are painful walking and wearing his shoes. He had surgery for removal of great toenail right foot which is now healed.  Painful callus under big toe joint left foot.  He returns for preventive foot care services.  General Appearance  Alert, conversant and in no acute stress.  Vascular  Dorsalis pedis  are palpable  bilaterally. Posterior tibial pulses are absent due to swelling. Capillary return is within normal limits  bilaterally. Temperature is within normal limits  bilaterally.  Neurologic  Senn-Weinstein monofilament wire test diminished  bilaterally. Muscle power within normal limits bilaterally.  Nails Thick disfigured discolored nails with subungual debris  from hallux to fifth toes bilaterally. No evidence of bacterial infection or drainage bilaterally.  Orthopedic  No limitations of motion  feet .  No crepitus or effusions noted.  No bony pathology or digital deformities noted. DJD 1st MPJ  B/L.  Skin  normotropic skin  noted bilaterally.  No signs of infections or ulcers noted.   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  Onychomycosis  Pain in toes right foot  Pain in toes left foot  Porokeratosis left foot  Debridement  of nails  1-5  B/L with a nail nipper.  Nails were then filed using a dremel tool with no incidents. Debride callus with # 15 blade.   RTC  3 months   Gardiner Barefoot DPM

## 2021-02-07 DIAGNOSIS — H35373 Puckering of macula, bilateral: Secondary | ICD-10-CM | POA: Diagnosis not present

## 2021-02-07 DIAGNOSIS — H01009 Unspecified blepharitis unspecified eye, unspecified eyelid: Secondary | ICD-10-CM | POA: Diagnosis not present

## 2021-02-10 ENCOUNTER — Ambulatory Visit: Payer: Medicare HMO | Admitting: Podiatry

## 2021-02-10 DIAGNOSIS — M79674 Pain in right toe(s): Secondary | ICD-10-CM | POA: Diagnosis not present

## 2021-02-10 DIAGNOSIS — Q66219 Congenital metatarsus primus varus, unspecified foot: Secondary | ICD-10-CM | POA: Diagnosis not present

## 2021-02-10 DIAGNOSIS — G20 Parkinson's disease: Secondary | ICD-10-CM | POA: Diagnosis not present

## 2021-02-10 DIAGNOSIS — L97521 Non-pressure chronic ulcer of other part of left foot limited to breakdown of skin: Secondary | ICD-10-CM | POA: Diagnosis not present

## 2021-02-10 DIAGNOSIS — B351 Tinea unguium: Secondary | ICD-10-CM | POA: Diagnosis not present

## 2021-02-10 DIAGNOSIS — M79675 Pain in left toe(s): Secondary | ICD-10-CM | POA: Diagnosis not present

## 2021-03-14 DIAGNOSIS — D2261 Melanocytic nevi of right upper limb, including shoulder: Secondary | ICD-10-CM | POA: Diagnosis not present

## 2021-03-14 DIAGNOSIS — Z872 Personal history of diseases of the skin and subcutaneous tissue: Secondary | ICD-10-CM | POA: Diagnosis not present

## 2021-03-14 DIAGNOSIS — X32XXXA Exposure to sunlight, initial encounter: Secondary | ICD-10-CM | POA: Diagnosis not present

## 2021-03-14 DIAGNOSIS — L570 Actinic keratosis: Secondary | ICD-10-CM | POA: Diagnosis not present

## 2021-03-14 DIAGNOSIS — Z85828 Personal history of other malignant neoplasm of skin: Secondary | ICD-10-CM | POA: Diagnosis not present

## 2021-03-14 DIAGNOSIS — D2262 Melanocytic nevi of left upper limb, including shoulder: Secondary | ICD-10-CM | POA: Diagnosis not present

## 2021-03-14 DIAGNOSIS — D2272 Melanocytic nevi of left lower limb, including hip: Secondary | ICD-10-CM | POA: Diagnosis not present

## 2021-03-14 DIAGNOSIS — I872 Venous insufficiency (chronic) (peripheral): Secondary | ICD-10-CM | POA: Diagnosis not present

## 2021-03-24 DIAGNOSIS — E785 Hyperlipidemia, unspecified: Secondary | ICD-10-CM | POA: Diagnosis not present

## 2021-03-24 DIAGNOSIS — Z1211 Encounter for screening for malignant neoplasm of colon: Secondary | ICD-10-CM | POA: Diagnosis not present

## 2021-03-24 DIAGNOSIS — Z0000 Encounter for general adult medical examination without abnormal findings: Secondary | ICD-10-CM | POA: Diagnosis not present

## 2021-03-24 DIAGNOSIS — E559 Vitamin D deficiency, unspecified: Secondary | ICD-10-CM | POA: Diagnosis not present

## 2021-03-24 DIAGNOSIS — Z79899 Other long term (current) drug therapy: Secondary | ICD-10-CM | POA: Diagnosis not present

## 2021-03-24 DIAGNOSIS — R972 Elevated prostate specific antigen [PSA]: Secondary | ICD-10-CM | POA: Diagnosis not present

## 2021-03-24 DIAGNOSIS — N400 Enlarged prostate without lower urinary tract symptoms: Secondary | ICD-10-CM | POA: Diagnosis not present

## 2021-03-24 DIAGNOSIS — G20 Parkinson's disease: Secondary | ICD-10-CM | POA: Diagnosis not present

## 2021-04-05 DIAGNOSIS — Z1211 Encounter for screening for malignant neoplasm of colon: Secondary | ICD-10-CM | POA: Diagnosis not present

## 2021-04-12 DIAGNOSIS — G20 Parkinson's disease: Secondary | ICD-10-CM | POA: Diagnosis not present

## 2021-04-25 DIAGNOSIS — R7989 Other specified abnormal findings of blood chemistry: Secondary | ICD-10-CM | POA: Diagnosis not present

## 2021-07-19 DIAGNOSIS — G20 Parkinson's disease: Secondary | ICD-10-CM | POA: Diagnosis not present

## 2021-07-19 DIAGNOSIS — R000 Tachycardia, unspecified: Secondary | ICD-10-CM | POA: Diagnosis not present

## 2021-07-25 DIAGNOSIS — R000 Tachycardia, unspecified: Secondary | ICD-10-CM | POA: Diagnosis not present

## 2021-07-25 DIAGNOSIS — R0602 Shortness of breath: Secondary | ICD-10-CM | POA: Diagnosis not present

## 2021-07-25 DIAGNOSIS — E7800 Pure hypercholesterolemia, unspecified: Secondary | ICD-10-CM | POA: Diagnosis not present

## 2021-07-25 DIAGNOSIS — R002 Palpitations: Secondary | ICD-10-CM | POA: Diagnosis not present

## 2021-07-25 DIAGNOSIS — R9431 Abnormal electrocardiogram [ECG] [EKG]: Secondary | ICD-10-CM | POA: Diagnosis not present

## 2021-08-15 DIAGNOSIS — R0602 Shortness of breath: Secondary | ICD-10-CM | POA: Diagnosis not present

## 2021-08-15 DIAGNOSIS — R9431 Abnormal electrocardiogram [ECG] [EKG]: Secondary | ICD-10-CM | POA: Diagnosis not present

## 2021-08-29 DIAGNOSIS — R002 Palpitations: Secondary | ICD-10-CM | POA: Diagnosis not present

## 2021-08-29 DIAGNOSIS — R0602 Shortness of breath: Secondary | ICD-10-CM | POA: Diagnosis not present

## 2021-08-29 DIAGNOSIS — E7800 Pure hypercholesterolemia, unspecified: Secondary | ICD-10-CM | POA: Diagnosis not present

## 2021-08-31 DIAGNOSIS — R002 Palpitations: Secondary | ICD-10-CM | POA: Diagnosis not present

## 2021-08-31 DIAGNOSIS — R000 Tachycardia, unspecified: Secondary | ICD-10-CM | POA: Diagnosis not present

## 2021-09-22 DIAGNOSIS — R002 Palpitations: Secondary | ICD-10-CM | POA: Diagnosis not present

## 2021-09-22 DIAGNOSIS — E559 Vitamin D deficiency, unspecified: Secondary | ICD-10-CM | POA: Diagnosis not present

## 2021-09-22 DIAGNOSIS — Z125 Encounter for screening for malignant neoplasm of prostate: Secondary | ICD-10-CM | POA: Diagnosis not present

## 2021-09-22 DIAGNOSIS — Z0000 Encounter for general adult medical examination without abnormal findings: Secondary | ICD-10-CM | POA: Diagnosis not present

## 2021-09-22 DIAGNOSIS — E785 Hyperlipidemia, unspecified: Secondary | ICD-10-CM | POA: Diagnosis not present

## 2021-09-22 DIAGNOSIS — N401 Enlarged prostate with lower urinary tract symptoms: Secondary | ICD-10-CM | POA: Diagnosis not present

## 2021-09-22 DIAGNOSIS — Z79899 Other long term (current) drug therapy: Secondary | ICD-10-CM | POA: Diagnosis not present

## 2021-09-22 DIAGNOSIS — G20 Parkinson's disease: Secondary | ICD-10-CM | POA: Diagnosis not present

## 2021-10-19 DIAGNOSIS — R413 Other amnesia: Secondary | ICD-10-CM | POA: Diagnosis not present

## 2021-10-19 DIAGNOSIS — G20 Parkinson's disease: Secondary | ICD-10-CM | POA: Diagnosis not present

## 2022-02-09 DIAGNOSIS — H35373 Puckering of macula, bilateral: Secondary | ICD-10-CM | POA: Diagnosis not present

## 2022-02-09 DIAGNOSIS — H01002 Unspecified blepharitis right lower eyelid: Secondary | ICD-10-CM | POA: Diagnosis not present

## 2022-02-09 DIAGNOSIS — H01005 Unspecified blepharitis left lower eyelid: Secondary | ICD-10-CM | POA: Diagnosis not present

## 2022-03-14 DIAGNOSIS — D2271 Melanocytic nevi of right lower limb, including hip: Secondary | ICD-10-CM | POA: Diagnosis not present

## 2022-03-14 DIAGNOSIS — D225 Melanocytic nevi of trunk: Secondary | ICD-10-CM | POA: Diagnosis not present

## 2022-03-14 DIAGNOSIS — L728 Other follicular cysts of the skin and subcutaneous tissue: Secondary | ICD-10-CM | POA: Diagnosis not present

## 2022-03-14 DIAGNOSIS — I872 Venous insufficiency (chronic) (peripheral): Secondary | ICD-10-CM | POA: Diagnosis not present

## 2022-03-14 DIAGNOSIS — D2262 Melanocytic nevi of left upper limb, including shoulder: Secondary | ICD-10-CM | POA: Diagnosis not present

## 2022-03-14 DIAGNOSIS — L570 Actinic keratosis: Secondary | ICD-10-CM | POA: Diagnosis not present

## 2022-03-14 DIAGNOSIS — L821 Other seborrheic keratosis: Secondary | ICD-10-CM | POA: Diagnosis not present

## 2022-03-14 DIAGNOSIS — D2261 Melanocytic nevi of right upper limb, including shoulder: Secondary | ICD-10-CM | POA: Diagnosis not present

## 2022-03-14 DIAGNOSIS — D2272 Melanocytic nevi of left lower limb, including hip: Secondary | ICD-10-CM | POA: Diagnosis not present

## 2022-03-28 DIAGNOSIS — E785 Hyperlipidemia, unspecified: Secondary | ICD-10-CM | POA: Diagnosis not present

## 2022-03-28 DIAGNOSIS — E559 Vitamin D deficiency, unspecified: Secondary | ICD-10-CM | POA: Diagnosis not present

## 2022-03-28 DIAGNOSIS — G20 Parkinson's disease: Secondary | ICD-10-CM | POA: Diagnosis not present

## 2022-03-28 DIAGNOSIS — Z79899 Other long term (current) drug therapy: Secondary | ICD-10-CM | POA: Diagnosis not present

## 2022-03-28 DIAGNOSIS — Z0000 Encounter for general adult medical examination without abnormal findings: Secondary | ICD-10-CM | POA: Diagnosis not present

## 2022-03-28 DIAGNOSIS — Z1211 Encounter for screening for malignant neoplasm of colon: Secondary | ICD-10-CM | POA: Diagnosis not present

## 2022-04-12 DIAGNOSIS — Z1211 Encounter for screening for malignant neoplasm of colon: Secondary | ICD-10-CM | POA: Diagnosis not present

## 2022-04-21 DIAGNOSIS — R413 Other amnesia: Secondary | ICD-10-CM | POA: Diagnosis not present

## 2022-04-21 DIAGNOSIS — G20A1 Parkinson's disease without dyskinesia, without mention of fluctuations: Secondary | ICD-10-CM | POA: Diagnosis not present

## 2022-08-15 DIAGNOSIS — M216X2 Other acquired deformities of left foot: Secondary | ICD-10-CM | POA: Diagnosis not present

## 2022-08-15 DIAGNOSIS — L909 Atrophic disorder of skin, unspecified: Secondary | ICD-10-CM | POA: Diagnosis not present

## 2022-08-15 DIAGNOSIS — M79672 Pain in left foot: Secondary | ICD-10-CM | POA: Diagnosis not present

## 2022-10-20 DIAGNOSIS — I951 Orthostatic hypotension: Secondary | ICD-10-CM | POA: Diagnosis not present

## 2022-10-20 DIAGNOSIS — R413 Other amnesia: Secondary | ICD-10-CM | POA: Diagnosis not present

## 2022-10-20 DIAGNOSIS — G20A1 Parkinson's disease without dyskinesia, without mention of fluctuations: Secondary | ICD-10-CM | POA: Diagnosis not present

## 2022-10-25 ENCOUNTER — Other Ambulatory Visit: Payer: Self-pay | Admitting: Student

## 2022-10-25 DIAGNOSIS — R413 Other amnesia: Secondary | ICD-10-CM

## 2022-10-28 ENCOUNTER — Ambulatory Visit
Admission: RE | Admit: 2022-10-28 | Discharge: 2022-10-28 | Disposition: A | Discharge status: Home / Self Care | Payer: Medicare HMO | Source: Ambulatory Visit | Attending: Student | Admitting: Student

## 2022-10-28 DIAGNOSIS — R413 Other amnesia: Secondary | ICD-10-CM | POA: Diagnosis not present

## 2022-11-09 DIAGNOSIS — G20A1 Parkinson's disease without dyskinesia, without mention of fluctuations: Secondary | ICD-10-CM | POA: Diagnosis not present

## 2022-11-09 DIAGNOSIS — K5790 Diverticulosis of intestine, part unspecified, without perforation or abscess without bleeding: Secondary | ICD-10-CM | POA: Diagnosis not present

## 2022-11-09 DIAGNOSIS — K222 Esophageal obstruction: Secondary | ICD-10-CM | POA: Diagnosis not present

## 2022-11-09 DIAGNOSIS — I951 Orthostatic hypotension: Secondary | ICD-10-CM | POA: Diagnosis not present

## 2022-11-09 DIAGNOSIS — D869 Sarcoidosis, unspecified: Secondary | ICD-10-CM | POA: Diagnosis not present

## 2022-11-09 DIAGNOSIS — E559 Vitamin D deficiency, unspecified: Secondary | ICD-10-CM | POA: Diagnosis not present

## 2022-11-09 DIAGNOSIS — K219 Gastro-esophageal reflux disease without esophagitis: Secondary | ICD-10-CM | POA: Diagnosis not present

## 2022-11-09 DIAGNOSIS — I872 Venous insufficiency (chronic) (peripheral): Secondary | ICD-10-CM | POA: Diagnosis not present

## 2022-11-09 DIAGNOSIS — E785 Hyperlipidemia, unspecified: Secondary | ICD-10-CM | POA: Diagnosis not present

## 2022-11-10 DIAGNOSIS — D869 Sarcoidosis, unspecified: Secondary | ICD-10-CM | POA: Diagnosis not present

## 2022-11-10 DIAGNOSIS — K222 Esophageal obstruction: Secondary | ICD-10-CM | POA: Diagnosis not present

## 2022-11-10 DIAGNOSIS — G20A1 Parkinson's disease without dyskinesia, without mention of fluctuations: Secondary | ICD-10-CM | POA: Diagnosis not present

## 2022-11-10 DIAGNOSIS — E785 Hyperlipidemia, unspecified: Secondary | ICD-10-CM | POA: Diagnosis not present

## 2022-11-10 DIAGNOSIS — I951 Orthostatic hypotension: Secondary | ICD-10-CM | POA: Diagnosis not present

## 2022-11-10 DIAGNOSIS — E559 Vitamin D deficiency, unspecified: Secondary | ICD-10-CM | POA: Diagnosis not present

## 2022-11-10 DIAGNOSIS — K219 Gastro-esophageal reflux disease without esophagitis: Secondary | ICD-10-CM | POA: Diagnosis not present

## 2022-11-10 DIAGNOSIS — I872 Venous insufficiency (chronic) (peripheral): Secondary | ICD-10-CM | POA: Diagnosis not present

## 2022-11-10 DIAGNOSIS — K5790 Diverticulosis of intestine, part unspecified, without perforation or abscess without bleeding: Secondary | ICD-10-CM | POA: Diagnosis not present

## 2022-11-21 DIAGNOSIS — I951 Orthostatic hypotension: Secondary | ICD-10-CM | POA: Diagnosis not present

## 2022-11-21 DIAGNOSIS — D869 Sarcoidosis, unspecified: Secondary | ICD-10-CM | POA: Diagnosis not present

## 2022-11-21 DIAGNOSIS — K222 Esophageal obstruction: Secondary | ICD-10-CM | POA: Diagnosis not present

## 2022-11-21 DIAGNOSIS — E785 Hyperlipidemia, unspecified: Secondary | ICD-10-CM | POA: Diagnosis not present

## 2022-11-21 DIAGNOSIS — K5790 Diverticulosis of intestine, part unspecified, without perforation or abscess without bleeding: Secondary | ICD-10-CM | POA: Diagnosis not present

## 2022-11-21 DIAGNOSIS — G20A1 Parkinson's disease without dyskinesia, without mention of fluctuations: Secondary | ICD-10-CM | POA: Diagnosis not present

## 2022-11-21 DIAGNOSIS — K219 Gastro-esophageal reflux disease without esophagitis: Secondary | ICD-10-CM | POA: Diagnosis not present

## 2022-11-21 DIAGNOSIS — I872 Venous insufficiency (chronic) (peripheral): Secondary | ICD-10-CM | POA: Diagnosis not present

## 2022-11-21 DIAGNOSIS — E559 Vitamin D deficiency, unspecified: Secondary | ICD-10-CM | POA: Diagnosis not present

## 2022-11-22 DIAGNOSIS — I951 Orthostatic hypotension: Secondary | ICD-10-CM | POA: Diagnosis not present

## 2022-11-22 DIAGNOSIS — K219 Gastro-esophageal reflux disease without esophagitis: Secondary | ICD-10-CM | POA: Diagnosis not present

## 2022-11-22 DIAGNOSIS — D869 Sarcoidosis, unspecified: Secondary | ICD-10-CM | POA: Diagnosis not present

## 2022-11-22 DIAGNOSIS — I872 Venous insufficiency (chronic) (peripheral): Secondary | ICD-10-CM | POA: Diagnosis not present

## 2022-11-22 DIAGNOSIS — E785 Hyperlipidemia, unspecified: Secondary | ICD-10-CM | POA: Diagnosis not present

## 2022-11-22 DIAGNOSIS — K5790 Diverticulosis of intestine, part unspecified, without perforation or abscess without bleeding: Secondary | ICD-10-CM | POA: Diagnosis not present

## 2022-11-22 DIAGNOSIS — K222 Esophageal obstruction: Secondary | ICD-10-CM | POA: Diagnosis not present

## 2022-11-22 DIAGNOSIS — E559 Vitamin D deficiency, unspecified: Secondary | ICD-10-CM | POA: Diagnosis not present

## 2022-11-22 DIAGNOSIS — G20A1 Parkinson's disease without dyskinesia, without mention of fluctuations: Secondary | ICD-10-CM | POA: Diagnosis not present

## 2022-11-23 DIAGNOSIS — K222 Esophageal obstruction: Secondary | ICD-10-CM | POA: Diagnosis not present

## 2022-11-23 DIAGNOSIS — K219 Gastro-esophageal reflux disease without esophagitis: Secondary | ICD-10-CM | POA: Diagnosis not present

## 2022-11-23 DIAGNOSIS — E785 Hyperlipidemia, unspecified: Secondary | ICD-10-CM | POA: Diagnosis not present

## 2022-11-23 DIAGNOSIS — I951 Orthostatic hypotension: Secondary | ICD-10-CM | POA: Diagnosis not present

## 2022-11-23 DIAGNOSIS — G20A1 Parkinson's disease without dyskinesia, without mention of fluctuations: Secondary | ICD-10-CM | POA: Diagnosis not present

## 2022-11-23 DIAGNOSIS — K5790 Diverticulosis of intestine, part unspecified, without perforation or abscess without bleeding: Secondary | ICD-10-CM | POA: Diagnosis not present

## 2022-11-23 DIAGNOSIS — I872 Venous insufficiency (chronic) (peripheral): Secondary | ICD-10-CM | POA: Diagnosis not present

## 2022-11-23 DIAGNOSIS — D869 Sarcoidosis, unspecified: Secondary | ICD-10-CM | POA: Diagnosis not present

## 2022-11-23 DIAGNOSIS — E559 Vitamin D deficiency, unspecified: Secondary | ICD-10-CM | POA: Diagnosis not present

## 2022-12-01 DIAGNOSIS — K222 Esophageal obstruction: Secondary | ICD-10-CM | POA: Diagnosis not present

## 2022-12-01 DIAGNOSIS — K5790 Diverticulosis of intestine, part unspecified, without perforation or abscess without bleeding: Secondary | ICD-10-CM | POA: Diagnosis not present

## 2022-12-01 DIAGNOSIS — I951 Orthostatic hypotension: Secondary | ICD-10-CM | POA: Diagnosis not present

## 2022-12-01 DIAGNOSIS — G20A1 Parkinson's disease without dyskinesia, without mention of fluctuations: Secondary | ICD-10-CM | POA: Diagnosis not present

## 2022-12-01 DIAGNOSIS — E559 Vitamin D deficiency, unspecified: Secondary | ICD-10-CM | POA: Diagnosis not present

## 2022-12-01 DIAGNOSIS — K219 Gastro-esophageal reflux disease without esophagitis: Secondary | ICD-10-CM | POA: Diagnosis not present

## 2022-12-01 DIAGNOSIS — I872 Venous insufficiency (chronic) (peripheral): Secondary | ICD-10-CM | POA: Diagnosis not present

## 2022-12-01 DIAGNOSIS — D869 Sarcoidosis, unspecified: Secondary | ICD-10-CM | POA: Diagnosis not present

## 2022-12-01 DIAGNOSIS — E785 Hyperlipidemia, unspecified: Secondary | ICD-10-CM | POA: Diagnosis not present

## 2022-12-08 DIAGNOSIS — I872 Venous insufficiency (chronic) (peripheral): Secondary | ICD-10-CM | POA: Diagnosis not present

## 2022-12-08 DIAGNOSIS — K219 Gastro-esophageal reflux disease without esophagitis: Secondary | ICD-10-CM | POA: Diagnosis not present

## 2022-12-08 DIAGNOSIS — E559 Vitamin D deficiency, unspecified: Secondary | ICD-10-CM | POA: Diagnosis not present

## 2022-12-08 DIAGNOSIS — K5790 Diverticulosis of intestine, part unspecified, without perforation or abscess without bleeding: Secondary | ICD-10-CM | POA: Diagnosis not present

## 2022-12-08 DIAGNOSIS — E785 Hyperlipidemia, unspecified: Secondary | ICD-10-CM | POA: Diagnosis not present

## 2022-12-08 DIAGNOSIS — D869 Sarcoidosis, unspecified: Secondary | ICD-10-CM | POA: Diagnosis not present

## 2022-12-08 DIAGNOSIS — K222 Esophageal obstruction: Secondary | ICD-10-CM | POA: Diagnosis not present

## 2022-12-08 DIAGNOSIS — I951 Orthostatic hypotension: Secondary | ICD-10-CM | POA: Diagnosis not present

## 2022-12-08 DIAGNOSIS — G20A1 Parkinson's disease without dyskinesia, without mention of fluctuations: Secondary | ICD-10-CM | POA: Diagnosis not present

## 2022-12-09 DIAGNOSIS — E559 Vitamin D deficiency, unspecified: Secondary | ICD-10-CM | POA: Diagnosis not present

## 2022-12-09 DIAGNOSIS — I951 Orthostatic hypotension: Secondary | ICD-10-CM | POA: Diagnosis not present

## 2022-12-09 DIAGNOSIS — I872 Venous insufficiency (chronic) (peripheral): Secondary | ICD-10-CM | POA: Diagnosis not present

## 2022-12-09 DIAGNOSIS — D869 Sarcoidosis, unspecified: Secondary | ICD-10-CM | POA: Diagnosis not present

## 2022-12-09 DIAGNOSIS — G20A1 Parkinson's disease without dyskinesia, without mention of fluctuations: Secondary | ICD-10-CM | POA: Diagnosis not present

## 2022-12-09 DIAGNOSIS — K219 Gastro-esophageal reflux disease without esophagitis: Secondary | ICD-10-CM | POA: Diagnosis not present

## 2022-12-09 DIAGNOSIS — K5790 Diverticulosis of intestine, part unspecified, without perforation or abscess without bleeding: Secondary | ICD-10-CM | POA: Diagnosis not present

## 2022-12-09 DIAGNOSIS — K222 Esophageal obstruction: Secondary | ICD-10-CM | POA: Diagnosis not present

## 2022-12-09 DIAGNOSIS — E785 Hyperlipidemia, unspecified: Secondary | ICD-10-CM | POA: Diagnosis not present

## 2022-12-14 DIAGNOSIS — K219 Gastro-esophageal reflux disease without esophagitis: Secondary | ICD-10-CM | POA: Diagnosis not present

## 2022-12-14 DIAGNOSIS — D869 Sarcoidosis, unspecified: Secondary | ICD-10-CM | POA: Diagnosis not present

## 2022-12-14 DIAGNOSIS — G20A1 Parkinson's disease without dyskinesia, without mention of fluctuations: Secondary | ICD-10-CM | POA: Diagnosis not present

## 2022-12-14 DIAGNOSIS — E785 Hyperlipidemia, unspecified: Secondary | ICD-10-CM | POA: Diagnosis not present

## 2022-12-14 DIAGNOSIS — I872 Venous insufficiency (chronic) (peripheral): Secondary | ICD-10-CM | POA: Diagnosis not present

## 2022-12-14 DIAGNOSIS — K222 Esophageal obstruction: Secondary | ICD-10-CM | POA: Diagnosis not present

## 2022-12-14 DIAGNOSIS — I951 Orthostatic hypotension: Secondary | ICD-10-CM | POA: Diagnosis not present

## 2022-12-14 DIAGNOSIS — E559 Vitamin D deficiency, unspecified: Secondary | ICD-10-CM | POA: Diagnosis not present

## 2022-12-14 DIAGNOSIS — K5790 Diverticulosis of intestine, part unspecified, without perforation or abscess without bleeding: Secondary | ICD-10-CM | POA: Diagnosis not present

## 2022-12-15 DIAGNOSIS — I951 Orthostatic hypotension: Secondary | ICD-10-CM | POA: Diagnosis not present

## 2022-12-15 DIAGNOSIS — G20A1 Parkinson's disease without dyskinesia, without mention of fluctuations: Secondary | ICD-10-CM | POA: Diagnosis not present

## 2022-12-15 DIAGNOSIS — R413 Other amnesia: Secondary | ICD-10-CM | POA: Diagnosis not present

## 2022-12-19 DIAGNOSIS — I872 Venous insufficiency (chronic) (peripheral): Secondary | ICD-10-CM | POA: Diagnosis not present

## 2022-12-19 DIAGNOSIS — D869 Sarcoidosis, unspecified: Secondary | ICD-10-CM | POA: Diagnosis not present

## 2022-12-19 DIAGNOSIS — E785 Hyperlipidemia, unspecified: Secondary | ICD-10-CM | POA: Diagnosis not present

## 2022-12-19 DIAGNOSIS — K222 Esophageal obstruction: Secondary | ICD-10-CM | POA: Diagnosis not present

## 2022-12-19 DIAGNOSIS — E559 Vitamin D deficiency, unspecified: Secondary | ICD-10-CM | POA: Diagnosis not present

## 2022-12-19 DIAGNOSIS — I951 Orthostatic hypotension: Secondary | ICD-10-CM | POA: Diagnosis not present

## 2022-12-19 DIAGNOSIS — K219 Gastro-esophageal reflux disease without esophagitis: Secondary | ICD-10-CM | POA: Diagnosis not present

## 2022-12-19 DIAGNOSIS — K5790 Diverticulosis of intestine, part unspecified, without perforation or abscess without bleeding: Secondary | ICD-10-CM | POA: Diagnosis not present

## 2022-12-19 DIAGNOSIS — G20A1 Parkinson's disease without dyskinesia, without mention of fluctuations: Secondary | ICD-10-CM | POA: Diagnosis not present

## 2022-12-21 DIAGNOSIS — I4891 Unspecified atrial fibrillation: Secondary | ICD-10-CM | POA: Diagnosis not present

## 2022-12-21 DIAGNOSIS — Z87898 Personal history of other specified conditions: Secondary | ICD-10-CM | POA: Diagnosis not present

## 2022-12-21 DIAGNOSIS — R0602 Shortness of breath: Secondary | ICD-10-CM | POA: Diagnosis not present

## 2022-12-21 DIAGNOSIS — E7800 Pure hypercholesterolemia, unspecified: Secondary | ICD-10-CM | POA: Diagnosis not present

## 2022-12-22 DIAGNOSIS — I872 Venous insufficiency (chronic) (peripheral): Secondary | ICD-10-CM | POA: Diagnosis not present

## 2022-12-22 DIAGNOSIS — D869 Sarcoidosis, unspecified: Secondary | ICD-10-CM | POA: Diagnosis not present

## 2022-12-22 DIAGNOSIS — K222 Esophageal obstruction: Secondary | ICD-10-CM | POA: Diagnosis not present

## 2022-12-22 DIAGNOSIS — I951 Orthostatic hypotension: Secondary | ICD-10-CM | POA: Diagnosis not present

## 2022-12-22 DIAGNOSIS — E559 Vitamin D deficiency, unspecified: Secondary | ICD-10-CM | POA: Diagnosis not present

## 2022-12-22 DIAGNOSIS — K5790 Diverticulosis of intestine, part unspecified, without perforation or abscess without bleeding: Secondary | ICD-10-CM | POA: Diagnosis not present

## 2022-12-22 DIAGNOSIS — G20A1 Parkinson's disease without dyskinesia, without mention of fluctuations: Secondary | ICD-10-CM | POA: Diagnosis not present

## 2022-12-22 DIAGNOSIS — K219 Gastro-esophageal reflux disease without esophagitis: Secondary | ICD-10-CM | POA: Diagnosis not present

## 2022-12-22 DIAGNOSIS — E785 Hyperlipidemia, unspecified: Secondary | ICD-10-CM | POA: Diagnosis not present

## 2022-12-26 DIAGNOSIS — I872 Venous insufficiency (chronic) (peripheral): Secondary | ICD-10-CM | POA: Diagnosis not present

## 2022-12-26 DIAGNOSIS — G20A1 Parkinson's disease without dyskinesia, without mention of fluctuations: Secondary | ICD-10-CM | POA: Diagnosis not present

## 2022-12-26 DIAGNOSIS — K5790 Diverticulosis of intestine, part unspecified, without perforation or abscess without bleeding: Secondary | ICD-10-CM | POA: Diagnosis not present

## 2022-12-26 DIAGNOSIS — I951 Orthostatic hypotension: Secondary | ICD-10-CM | POA: Diagnosis not present

## 2022-12-26 DIAGNOSIS — D869 Sarcoidosis, unspecified: Secondary | ICD-10-CM | POA: Diagnosis not present

## 2022-12-26 DIAGNOSIS — E785 Hyperlipidemia, unspecified: Secondary | ICD-10-CM | POA: Diagnosis not present

## 2022-12-26 DIAGNOSIS — K222 Esophageal obstruction: Secondary | ICD-10-CM | POA: Diagnosis not present

## 2022-12-26 DIAGNOSIS — K219 Gastro-esophageal reflux disease without esophagitis: Secondary | ICD-10-CM | POA: Diagnosis not present

## 2022-12-26 DIAGNOSIS — E559 Vitamin D deficiency, unspecified: Secondary | ICD-10-CM | POA: Diagnosis not present

## 2022-12-27 ENCOUNTER — Other Ambulatory Visit: Payer: Self-pay | Admitting: Internal Medicine

## 2022-12-27 DIAGNOSIS — I951 Orthostatic hypotension: Secondary | ICD-10-CM | POA: Diagnosis not present

## 2022-12-27 DIAGNOSIS — I872 Venous insufficiency (chronic) (peripheral): Secondary | ICD-10-CM | POA: Diagnosis not present

## 2022-12-27 DIAGNOSIS — K222 Esophageal obstruction: Secondary | ICD-10-CM | POA: Diagnosis not present

## 2022-12-27 DIAGNOSIS — D869 Sarcoidosis, unspecified: Secondary | ICD-10-CM | POA: Diagnosis not present

## 2022-12-27 DIAGNOSIS — E7800 Pure hypercholesterolemia, unspecified: Secondary | ICD-10-CM

## 2022-12-27 DIAGNOSIS — K219 Gastro-esophageal reflux disease without esophagitis: Secondary | ICD-10-CM | POA: Diagnosis not present

## 2022-12-27 DIAGNOSIS — K5790 Diverticulosis of intestine, part unspecified, without perforation or abscess without bleeding: Secondary | ICD-10-CM | POA: Diagnosis not present

## 2022-12-27 DIAGNOSIS — E785 Hyperlipidemia, unspecified: Secondary | ICD-10-CM | POA: Diagnosis not present

## 2022-12-27 DIAGNOSIS — E559 Vitamin D deficiency, unspecified: Secondary | ICD-10-CM | POA: Diagnosis not present

## 2022-12-27 DIAGNOSIS — G20A1 Parkinson's disease without dyskinesia, without mention of fluctuations: Secondary | ICD-10-CM | POA: Diagnosis not present

## 2023-01-01 ENCOUNTER — Ambulatory Visit
Admission: RE | Admit: 2023-01-01 | Discharge: 2023-01-01 | Disposition: A | Discharge status: Home / Self Care | Payer: Medicare HMO | Source: Ambulatory Visit | Attending: Internal Medicine | Admitting: Internal Medicine

## 2023-01-01 DIAGNOSIS — E7800 Pure hypercholesterolemia, unspecified: Secondary | ICD-10-CM | POA: Insufficient documentation

## 2023-01-02 DIAGNOSIS — K222 Esophageal obstruction: Secondary | ICD-10-CM | POA: Diagnosis not present

## 2023-01-02 DIAGNOSIS — E785 Hyperlipidemia, unspecified: Secondary | ICD-10-CM | POA: Diagnosis not present

## 2023-01-02 DIAGNOSIS — I872 Venous insufficiency (chronic) (peripheral): Secondary | ICD-10-CM | POA: Diagnosis not present

## 2023-01-02 DIAGNOSIS — K5790 Diverticulosis of intestine, part unspecified, without perforation or abscess without bleeding: Secondary | ICD-10-CM | POA: Diagnosis not present

## 2023-01-02 DIAGNOSIS — K219 Gastro-esophageal reflux disease without esophagitis: Secondary | ICD-10-CM | POA: Diagnosis not present

## 2023-01-02 DIAGNOSIS — D869 Sarcoidosis, unspecified: Secondary | ICD-10-CM | POA: Diagnosis not present

## 2023-01-02 DIAGNOSIS — G20A1 Parkinson's disease without dyskinesia, without mention of fluctuations: Secondary | ICD-10-CM | POA: Diagnosis not present

## 2023-01-02 DIAGNOSIS — E559 Vitamin D deficiency, unspecified: Secondary | ICD-10-CM | POA: Diagnosis not present

## 2023-01-02 DIAGNOSIS — I951 Orthostatic hypotension: Secondary | ICD-10-CM | POA: Diagnosis not present

## 2023-01-05 DIAGNOSIS — K5790 Diverticulosis of intestine, part unspecified, without perforation or abscess without bleeding: Secondary | ICD-10-CM | POA: Diagnosis not present

## 2023-01-05 DIAGNOSIS — D869 Sarcoidosis, unspecified: Secondary | ICD-10-CM | POA: Diagnosis not present

## 2023-01-05 DIAGNOSIS — K222 Esophageal obstruction: Secondary | ICD-10-CM | POA: Diagnosis not present

## 2023-01-05 DIAGNOSIS — K219 Gastro-esophageal reflux disease without esophagitis: Secondary | ICD-10-CM | POA: Diagnosis not present

## 2023-01-05 DIAGNOSIS — I872 Venous insufficiency (chronic) (peripheral): Secondary | ICD-10-CM | POA: Diagnosis not present

## 2023-01-05 DIAGNOSIS — G20A1 Parkinson's disease without dyskinesia, without mention of fluctuations: Secondary | ICD-10-CM | POA: Diagnosis not present

## 2023-01-05 DIAGNOSIS — I951 Orthostatic hypotension: Secondary | ICD-10-CM | POA: Diagnosis not present

## 2023-01-05 DIAGNOSIS — E785 Hyperlipidemia, unspecified: Secondary | ICD-10-CM | POA: Diagnosis not present

## 2023-01-05 DIAGNOSIS — E559 Vitamin D deficiency, unspecified: Secondary | ICD-10-CM | POA: Diagnosis not present

## 2023-02-27 DIAGNOSIS — H35373 Puckering of macula, bilateral: Secondary | ICD-10-CM | POA: Diagnosis not present

## 2023-03-19 DIAGNOSIS — Z872 Personal history of diseases of the skin and subcutaneous tissue: Secondary | ICD-10-CM | POA: Diagnosis not present

## 2023-03-19 DIAGNOSIS — L739 Follicular disorder, unspecified: Secondary | ICD-10-CM | POA: Diagnosis not present

## 2023-03-19 DIAGNOSIS — L570 Actinic keratosis: Secondary | ICD-10-CM | POA: Diagnosis not present

## 2023-03-19 DIAGNOSIS — D485 Neoplasm of uncertain behavior of skin: Secondary | ICD-10-CM | POA: Diagnosis not present

## 2023-03-19 DIAGNOSIS — D2262 Melanocytic nevi of left upper limb, including shoulder: Secondary | ICD-10-CM | POA: Diagnosis not present

## 2023-03-19 DIAGNOSIS — D2271 Melanocytic nevi of right lower limb, including hip: Secondary | ICD-10-CM | POA: Diagnosis not present

## 2023-03-19 DIAGNOSIS — Z85828 Personal history of other malignant neoplasm of skin: Secondary | ICD-10-CM | POA: Diagnosis not present

## 2023-03-19 DIAGNOSIS — D225 Melanocytic nevi of trunk: Secondary | ICD-10-CM | POA: Diagnosis not present

## 2023-03-19 DIAGNOSIS — D2261 Melanocytic nevi of right upper limb, including shoulder: Secondary | ICD-10-CM | POA: Diagnosis not present

## 2023-03-19 DIAGNOSIS — X32XXXA Exposure to sunlight, initial encounter: Secondary | ICD-10-CM | POA: Diagnosis not present

## 2023-09-03 DIAGNOSIS — G20B1 Parkinson's disease with dyskinesia, without mention of fluctuations: Secondary | ICD-10-CM | POA: Diagnosis not present

## 2023-09-03 DIAGNOSIS — S42202A Unspecified fracture of upper end of left humerus, initial encounter for closed fracture: Secondary | ICD-10-CM | POA: Diagnosis not present

## 2023-09-03 DIAGNOSIS — M25512 Pain in left shoulder: Secondary | ICD-10-CM | POA: Diagnosis not present

## 2023-09-03 DIAGNOSIS — Y92009 Unspecified place in unspecified non-institutional (private) residence as the place of occurrence of the external cause: Secondary | ICD-10-CM | POA: Diagnosis not present

## 2023-09-03 DIAGNOSIS — W010XXA Fall on same level from slipping, tripping and stumbling without subsequent striking against object, initial encounter: Secondary | ICD-10-CM | POA: Diagnosis not present

## 2023-09-03 DIAGNOSIS — R55 Syncope and collapse: Secondary | ICD-10-CM | POA: Diagnosis not present

## 2023-09-05 ENCOUNTER — Ambulatory Visit
Admission: RE | Admit: 2023-09-05 | Discharge: 2023-09-05 | Disposition: A | Discharge status: Home / Self Care | Source: Ambulatory Visit | Attending: Orthopedic Surgery | Admitting: Orthopedic Surgery

## 2023-09-05 ENCOUNTER — Other Ambulatory Visit: Payer: Self-pay | Admitting: Orthopedic Surgery

## 2023-09-05 DIAGNOSIS — S42202A Unspecified fracture of upper end of left humerus, initial encounter for closed fracture: Secondary | ICD-10-CM | POA: Diagnosis not present

## 2023-09-05 DIAGNOSIS — M19012 Primary osteoarthritis, left shoulder: Secondary | ICD-10-CM | POA: Diagnosis not present

## 2023-09-05 DIAGNOSIS — S42212A Unspecified displaced fracture of surgical neck of left humerus, initial encounter for closed fracture: Secondary | ICD-10-CM | POA: Diagnosis not present

## 2023-09-06 DIAGNOSIS — S42202A Unspecified fracture of upper end of left humerus, initial encounter for closed fracture: Secondary | ICD-10-CM | POA: Diagnosis not present

## 2023-09-18 DIAGNOSIS — G20A1 Parkinson's disease without dyskinesia, without mention of fluctuations: Secondary | ICD-10-CM | POA: Diagnosis not present

## 2023-09-18 DIAGNOSIS — I739 Peripheral vascular disease, unspecified: Secondary | ICD-10-CM | POA: Diagnosis not present

## 2023-09-18 DIAGNOSIS — I951 Orthostatic hypotension: Secondary | ICD-10-CM | POA: Diagnosis not present

## 2023-10-29 DIAGNOSIS — S42202A Unspecified fracture of upper end of left humerus, initial encounter for closed fracture: Secondary | ICD-10-CM | POA: Diagnosis not present

## 2023-11-12 DIAGNOSIS — S42202D Unspecified fracture of upper end of left humerus, subsequent encounter for fracture with routine healing: Secondary | ICD-10-CM | POA: Diagnosis not present

## 2023-11-20 DIAGNOSIS — S42202D Unspecified fracture of upper end of left humerus, subsequent encounter for fracture with routine healing: Secondary | ICD-10-CM | POA: Diagnosis not present

## 2023-11-27 DIAGNOSIS — S42202D Unspecified fracture of upper end of left humerus, subsequent encounter for fracture with routine healing: Secondary | ICD-10-CM | POA: Diagnosis not present

## 2023-12-04 DIAGNOSIS — S42202D Unspecified fracture of upper end of left humerus, subsequent encounter for fracture with routine healing: Secondary | ICD-10-CM | POA: Diagnosis not present

## 2023-12-12 DIAGNOSIS — S42202D Unspecified fracture of upper end of left humerus, subsequent encounter for fracture with routine healing: Secondary | ICD-10-CM | POA: Diagnosis not present

## 2023-12-19 DIAGNOSIS — S42202D Unspecified fracture of upper end of left humerus, subsequent encounter for fracture with routine healing: Secondary | ICD-10-CM | POA: Diagnosis not present

## 2023-12-26 DIAGNOSIS — Z1331 Encounter for screening for depression: Secondary | ICD-10-CM | POA: Diagnosis not present

## 2023-12-26 DIAGNOSIS — R4189 Other symptoms and signs involving cognitive functions and awareness: Secondary | ICD-10-CM | POA: Diagnosis not present

## 2023-12-26 DIAGNOSIS — G20A1 Parkinson's disease without dyskinesia, without mention of fluctuations: Secondary | ICD-10-CM | POA: Diagnosis not present

## 2023-12-26 DIAGNOSIS — R634 Abnormal weight loss: Secondary | ICD-10-CM | POA: Diagnosis not present

## 2023-12-28 DIAGNOSIS — S42202D Unspecified fracture of upper end of left humerus, subsequent encounter for fracture with routine healing: Secondary | ICD-10-CM | POA: Diagnosis not present

## 2023-12-31 DIAGNOSIS — S42202A Unspecified fracture of upper end of left humerus, initial encounter for closed fracture: Secondary | ICD-10-CM | POA: Diagnosis not present

## 2024-01-09 DIAGNOSIS — Z125 Encounter for screening for malignant neoplasm of prostate: Secondary | ICD-10-CM | POA: Diagnosis not present

## 2024-01-09 DIAGNOSIS — G20A1 Parkinson's disease without dyskinesia, without mention of fluctuations: Secondary | ICD-10-CM | POA: Diagnosis not present

## 2024-01-09 DIAGNOSIS — E559 Vitamin D deficiency, unspecified: Secondary | ICD-10-CM | POA: Diagnosis not present

## 2024-01-09 DIAGNOSIS — N400 Enlarged prostate without lower urinary tract symptoms: Secondary | ICD-10-CM | POA: Diagnosis not present

## 2024-01-09 DIAGNOSIS — Z1331 Encounter for screening for depression: Secondary | ICD-10-CM | POA: Diagnosis not present

## 2024-01-09 DIAGNOSIS — E785 Hyperlipidemia, unspecified: Secondary | ICD-10-CM | POA: Diagnosis not present

## 2024-01-09 DIAGNOSIS — Z1211 Encounter for screening for malignant neoplasm of colon: Secondary | ICD-10-CM | POA: Diagnosis not present

## 2024-01-09 DIAGNOSIS — Z0000 Encounter for general adult medical examination without abnormal findings: Secondary | ICD-10-CM | POA: Diagnosis not present

## 2024-01-09 DIAGNOSIS — Z79899 Other long term (current) drug therapy: Secondary | ICD-10-CM | POA: Diagnosis not present

## 2024-01-11 DIAGNOSIS — S42202D Unspecified fracture of upper end of left humerus, subsequent encounter for fracture with routine healing: Secondary | ICD-10-CM | POA: Diagnosis not present

## 2024-01-16 DIAGNOSIS — S42202D Unspecified fracture of upper end of left humerus, subsequent encounter for fracture with routine healing: Secondary | ICD-10-CM | POA: Diagnosis not present

## 2024-01-22 DIAGNOSIS — S42202D Unspecified fracture of upper end of left humerus, subsequent encounter for fracture with routine healing: Secondary | ICD-10-CM | POA: Diagnosis not present

## 2024-01-28 DIAGNOSIS — R55 Syncope and collapse: Secondary | ICD-10-CM | POA: Diagnosis not present

## 2024-01-28 DIAGNOSIS — E7800 Pure hypercholesterolemia, unspecified: Secondary | ICD-10-CM | POA: Diagnosis not present

## 2024-01-28 DIAGNOSIS — Z1211 Encounter for screening for malignant neoplasm of colon: Secondary | ICD-10-CM | POA: Diagnosis not present

## 2024-01-28 DIAGNOSIS — Z87898 Personal history of other specified conditions: Secondary | ICD-10-CM | POA: Diagnosis not present

## 2024-01-28 DIAGNOSIS — G20B1 Parkinson's disease with dyskinesia, without mention of fluctuations: Secondary | ICD-10-CM | POA: Diagnosis not present

## 2024-01-28 DIAGNOSIS — R0602 Shortness of breath: Secondary | ICD-10-CM | POA: Diagnosis not present

## 2024-01-28 DIAGNOSIS — R42 Dizziness and giddiness: Secondary | ICD-10-CM | POA: Diagnosis not present

## 2024-01-28 DIAGNOSIS — I4891 Unspecified atrial fibrillation: Secondary | ICD-10-CM | POA: Diagnosis not present

## 2024-01-29 DIAGNOSIS — S42202D Unspecified fracture of upper end of left humerus, subsequent encounter for fracture with routine healing: Secondary | ICD-10-CM | POA: Diagnosis not present

## 2024-02-05 DIAGNOSIS — S42202D Unspecified fracture of upper end of left humerus, subsequent encounter for fracture with routine healing: Secondary | ICD-10-CM | POA: Diagnosis not present

## 2024-02-12 DIAGNOSIS — S42202D Unspecified fracture of upper end of left humerus, subsequent encounter for fracture with routine healing: Secondary | ICD-10-CM | POA: Diagnosis not present

## 2024-02-19 DIAGNOSIS — S42202D Unspecified fracture of upper end of left humerus, subsequent encounter for fracture with routine healing: Secondary | ICD-10-CM | POA: Diagnosis not present

## 2024-02-27 DIAGNOSIS — H35373 Puckering of macula, bilateral: Secondary | ICD-10-CM | POA: Diagnosis not present

## 2024-02-28 DIAGNOSIS — S42202D Unspecified fracture of upper end of left humerus, subsequent encounter for fracture with routine healing: Secondary | ICD-10-CM | POA: Diagnosis not present

## 2024-03-13 DIAGNOSIS — S42202D Unspecified fracture of upper end of left humerus, subsequent encounter for fracture with routine healing: Secondary | ICD-10-CM | POA: Diagnosis not present

## 2024-03-20 DIAGNOSIS — L821 Other seborrheic keratosis: Secondary | ICD-10-CM | POA: Diagnosis not present

## 2024-03-20 DIAGNOSIS — Z09 Encounter for follow-up examination after completed treatment for conditions other than malignant neoplasm: Secondary | ICD-10-CM | POA: Diagnosis not present

## 2024-03-20 DIAGNOSIS — D2261 Melanocytic nevi of right upper limb, including shoulder: Secondary | ICD-10-CM | POA: Diagnosis not present

## 2024-03-20 DIAGNOSIS — D2271 Melanocytic nevi of right lower limb, including hip: Secondary | ICD-10-CM | POA: Diagnosis not present

## 2024-03-20 DIAGNOSIS — D2262 Melanocytic nevi of left upper limb, including shoulder: Secondary | ICD-10-CM | POA: Diagnosis not present

## 2024-03-20 DIAGNOSIS — Z872 Personal history of diseases of the skin and subcutaneous tissue: Secondary | ICD-10-CM | POA: Diagnosis not present

## 2024-03-20 DIAGNOSIS — Z08 Encounter for follow-up examination after completed treatment for malignant neoplasm: Secondary | ICD-10-CM | POA: Diagnosis not present

## 2024-03-20 DIAGNOSIS — D2272 Melanocytic nevi of left lower limb, including hip: Secondary | ICD-10-CM | POA: Diagnosis not present

## 2024-03-20 DIAGNOSIS — D225 Melanocytic nevi of trunk: Secondary | ICD-10-CM | POA: Diagnosis not present

## 2024-04-03 DIAGNOSIS — S42202A Unspecified fracture of upper end of left humerus, initial encounter for closed fracture: Secondary | ICD-10-CM | POA: Diagnosis not present
